# Patient Record
Sex: Female | Born: 1955 | Race: White | Hispanic: No | Marital: Married | Smoking: Never smoker
Health system: Southern US, Community
[De-identification: ages and names within clinical notes are randomized; demographics above are authoritative.]

## PROBLEM LIST (undated history)

## (undated) DIAGNOSIS — M8580 Other specified disorders of bone density and structure, unspecified site: Secondary | ICD-10-CM

## (undated) DIAGNOSIS — I10 Essential (primary) hypertension: Secondary | ICD-10-CM

## (undated) DIAGNOSIS — C4490 Unspecified malignant neoplasm of skin, unspecified: Secondary | ICD-10-CM

## (undated) DIAGNOSIS — Z8042 Family history of malignant neoplasm of prostate: Secondary | ICD-10-CM

## (undated) DIAGNOSIS — C50919 Malignant neoplasm of unspecified site of unspecified female breast: Secondary | ICD-10-CM

## (undated) DIAGNOSIS — E785 Hyperlipidemia, unspecified: Secondary | ICD-10-CM

## (undated) DIAGNOSIS — Z803 Family history of malignant neoplasm of breast: Secondary | ICD-10-CM

## (undated) DIAGNOSIS — Z923 Personal history of irradiation: Secondary | ICD-10-CM

## (undated) DIAGNOSIS — R112 Nausea with vomiting, unspecified: Secondary | ICD-10-CM

## (undated) DIAGNOSIS — Z9889 Other specified postprocedural states: Secondary | ICD-10-CM

## (undated) DIAGNOSIS — Z800 Family history of malignant neoplasm of digestive organs: Secondary | ICD-10-CM

## (undated) DIAGNOSIS — N189 Chronic kidney disease, unspecified: Secondary | ICD-10-CM

## (undated) HISTORY — DX: Other specified disorders of bone density and structure, unspecified site: M85.80

## (undated) HISTORY — DX: Family history of malignant neoplasm of prostate: Z80.42

## (undated) HISTORY — DX: Personal history of irradiation: Z92.3

## (undated) HISTORY — DX: Family history of malignant neoplasm of digestive organs: Z80.0

## (undated) HISTORY — PX: EYE SURGERY: SHX253

## (undated) HISTORY — PX: ROTATOR CUFF REPAIR: SHX139

## (undated) HISTORY — DX: Unspecified malignant neoplasm of skin, unspecified: C44.90

## (undated) HISTORY — DX: Essential (primary) hypertension: I10

## (undated) HISTORY — DX: Hyperlipidemia, unspecified: E78.5

## (undated) HISTORY — DX: Family history of malignant neoplasm of breast: Z80.3

## (undated) HISTORY — DX: Chronic kidney disease, unspecified: N18.9

---

## 2017-04-28 DIAGNOSIS — E782 Mixed hyperlipidemia: Secondary | ICD-10-CM | POA: Insufficient documentation

## 2017-04-28 DIAGNOSIS — I10 Essential (primary) hypertension: Secondary | ICD-10-CM | POA: Insufficient documentation

## 2017-04-28 DIAGNOSIS — N200 Calculus of kidney: Secondary | ICD-10-CM | POA: Insufficient documentation

## 2017-04-28 DIAGNOSIS — M8580 Other specified disorders of bone density and structure, unspecified site: Secondary | ICD-10-CM | POA: Insufficient documentation

## 2019-09-13 ENCOUNTER — Other Ambulatory Visit: Payer: Self-pay | Admitting: Family Medicine

## 2019-09-13 DIAGNOSIS — N6489 Other specified disorders of breast: Secondary | ICD-10-CM

## 2019-09-22 ENCOUNTER — Other Ambulatory Visit: Payer: Self-pay

## 2019-09-22 ENCOUNTER — Ambulatory Visit
Admission: RE | Admit: 2019-09-22 | Discharge: 2019-09-22 | Disposition: A | Discharge status: Home / Self Care | Payer: 59 | Source: Ambulatory Visit | Attending: Family Medicine | Admitting: Family Medicine

## 2019-09-22 ENCOUNTER — Ambulatory Visit: Payer: Self-pay | Admitting: General Surgery

## 2019-09-22 ENCOUNTER — Ambulatory Visit
Admission: RE | Admit: 2019-09-22 | Discharge: 2019-09-22 | Disposition: A | Discharge status: Home / Self Care | Payer: Self-pay | Source: Ambulatory Visit | Attending: Family Medicine | Admitting: Family Medicine

## 2019-09-22 DIAGNOSIS — N6489 Other specified disorders of breast: Secondary | ICD-10-CM

## 2019-09-22 DIAGNOSIS — Z170 Estrogen receptor positive status [ER+]: Secondary | ICD-10-CM

## 2019-09-22 DIAGNOSIS — C50212 Malignant neoplasm of upper-inner quadrant of left female breast: Secondary | ICD-10-CM

## 2019-09-22 HISTORY — PX: BREAST BIOPSY: SHX20

## 2019-09-28 ENCOUNTER — Telehealth: Payer: Self-pay | Admitting: Hematology

## 2019-09-28 NOTE — Telephone Encounter (Signed)
Received a new pt referral from Dr. Marlou Starks at [facility] for new dx of breast cancer. Sierra Lara has been cld and scheduled to see Dr. Burr Medico on 9/23 at 3pm. Pt aware to arrive 20 minutes early.

## 2019-09-30 ENCOUNTER — Other Ambulatory Visit: Payer: Self-pay | Admitting: General Surgery

## 2019-09-30 DIAGNOSIS — C50212 Malignant neoplasm of upper-inner quadrant of left female breast: Secondary | ICD-10-CM

## 2019-09-30 DIAGNOSIS — Z170 Estrogen receptor positive status [ER+]: Secondary | ICD-10-CM

## 2019-10-06 NOTE — Progress Notes (Signed)
[facility]   Telephone:([phone]) [phone] Fax:([phone]) [place] Note   Patient Care Team: [name], MD as PCP - General (Family Medicine)  Date of Service:  10/07/2019  CHIEF COMPLAINTS/PURPOSE OF CONSULTATION:  Newly diagnosed left breast cancer   REFERRING PHYSICIAN:  Dr Marlou Starks   Oncology History Overview Note  Cancer Staging Malignant neoplasm of upper-inner quadrant of left breast in female, estrogen receptor positive ([place]) Staging form: Breast, AJCC 8th Edition - Clinical stage from 09/22/2019: Stage IA (cT1a, cN0, cM0, G1, ER+, PR+, HER2-) - Signed by Truitt Merle, MD on 10/07/2019    Malignant neoplasm of upper-inner quadrant of left breast in female, estrogen receptor positive ([place])  09/08/2019 Mammogram   Left Diagnostic Mammogram  IMPRESSION 1.Suspicious 75m mass involving the UPPER OUTER QUADRANT of the LEFT breast at the 11:00 position approximately 6cmfn. This is the likely sonographic correlate for the screening mammographic architectural distortion.  2. No pathologic LEFT axillary lymphadnopathy.    09/13/2019 Initial Biopsy   Diagnosis  Breast, left, needle core biopsy, 11:00 position, 6cmfn -INVASIVE LOBULAR CARCINOMA, GRADE 2, AND LOBULAR CARCINOMA IN SITU -TUMOR INVOLVES ALL CORES AND MEASURES 7MM IN MAXIMUM EXTENT IN A SINGLE CORE.  -A BREAST PROGNOSTIC PROFILE WILL BE ORDERED ON BLOCK 1A AND SEPARATELY REPORTED -See comment    09/13/2019 Receptors her2   ER 90% positive, moderately staining intensity PR 80% positive, strong staining intensity  Proliferation Marker Ki67: 2%  [facility] Equivocal [facility]  09/22/2019 Cancer Staging   Staging form: Breast, AJCC 8th Edition - Clinical stage from 09/22/2019: Stage IA (cT1a, cN0, cM0, G1, ER+, PR+, HER2-) - Signed by FTruitt Merle MD on 10/07/2019   09/22/2019 Initial Biopsy   Diagnosis 09/22/19 Breast, left, needle core biopsy, uiq - INVASIVE MAMMARY CARCINOMA, SEE  COMMENT. - MAMMARY CARCINOMA IN SITU. Microscopic Comment The carcinoma appears grade 1 and measures 3 mm in greatest linear extent. E-cadherin will be ordered. Prognostic makers will be ordered. Dr. KVic Ripperhas reviewed the case. [facility].   09/22/2019 Receptors her2   PROGNOSTIC INDICATORS Results: IMMUNOHISTOCHEMICAL AND MORPHOMETRIC ANALYSIS PERFORMED MANUALLY The tumor cells are NEGATIVE for Her2 (1+). Estrogen Receptor: 95%, POSITIVE, STRONG STAINING INTENSITY Progesterone Receptor: 2%, POSITIVE, STRONG STAINING INTENSITY Proliferation Marker Ki67: 15%    10/07/2019 Initial Diagnosis   Malignant neoplasm of upper-inner quadrant of left breast in female, estrogen receptor positive ([place]      HISTORY OF PRESENTING ILLNESS:  [name]89y.o. female is a here because of newly diagnosed left breast cancer. The patient was referred by Dr TMarlou Starks The patient presents to the clinic today accompanied by her husband.   This was discovered by screening mammogram, she did not have any abnormal mammogram or breast biopsy prior to this.  Mammogram showed architectural distortion in the inner upper quadrant of left breast, ultrasound showed an irregular hypoechoic mass at the 11 o'clock position, measuring 5 x 4 x 5 mm, 6 cm from nipple.  Ultrasound of the axilla was negative for adenopathy.  She underwent biopsy in [place], which showed invasive lobular carcinoma, grade 2, and lobular carcinoma in situ.  ER/PR strongly positive, HER-2 negative.  She underwent second biopsy at breast center along, which showed same findings.  She denies any palpable breast mass, skin change or nipple discharge.  She feels well overall, denies any significant pain, or other new symptoms.  She has good appetite and energy level, her weight has been stable lately.  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  Her past medical  history is negative except hypertension.  She is married, lives with her husband.  They recently moved from [place] to [place].    GYN HISTORY  Menarchal: 14  LMP: 2011  Contraceptive: Yes, 16 years  HRT: no  G3P3    REVIEW OF SYSTEMS:    Constitutional: Denies fevers, chills or abnormal night sweats Eyes: Denies blurriness of vision, double vision or watery eyes Ears, nose, mouth, throat, and face: Denies mucositis or sore throat Respiratory: Denies cough, dyspnea or wheezes Cardiovascular: Denies palpitation, chest discomfort or lower extremity swelling Gastrointestinal:  Denies nausea, heartburn or change in bowel habits Skin: Denies abnormal skin rashes Lymphatics: Denies new lymphadenopathy or easy bruising Neurological:Denies numbness, tingling or new weaknesses Behavioral/Psych: Mood is stable, no new changes  All other systems were reviewed with the patient and are negative.   MEDICAL HISTORY:  Past Medical History:  Diagnosis Date  . Hypertension   . Skin cancer     SURGICAL HISTORY: Past Surgical History:  Procedure Laterality Date  . EYE SURGERY    . ROTATOR CUFF REPAIR Right     SOCIAL HISTORY: Social History   Socioeconomic History  . Marital status: Married    Spouse name: Not on file  . Number of children: 3  . Years of education: Not on file  . Highest education level: Not on file  Occupational History  . Occupation: nurse   Tobacco Use  . Smoking status: Never Smoker  . Smokeless tobacco: Never Used  Substance and Sexual Activity  . Alcohol use: Not Currently    Comment: social drinker   . Drug use: Never  . Sexual activity: Not on file  Other Topics Concern  . Not on file  Social History Narrative  . Not on file   Social Determinants of Health   Financial Resource Strain:   . Difficulty of Paying Living Expenses: Not on file  Food Insecurity:   . Worried About Charity fundraiser in the Last Year: Not on file  . Ran Out of  Food in the Last Year: Not on file  Transportation Needs:   . Lack of Transportation (Medical): Not on file  . Lack of Transportation (Non-Medical): Not on file  Physical Activity:   . Days of Exercise per Week: Not on file  . Minutes of Exercise per Session: Not on file  Stress:   . Feeling of Stress : Not on file  Social Connections:   . Frequency of Communication with Friends and Family: Not on file  . Frequency of Social Gatherings with Friends and Family: Not on file  . Attends Religious Services: Not on file  . Active Member of Clubs or Organizations: Not on file  . Attends Archivist Meetings: Not on file  . Marital Status: Not on file  Intimate Partner Violence:   . Fear of Current or Ex-Partner: Not on file  . Emotionally Abused: Not on file  . Physically Abused: Not on file  . Sexually Abused: Not on file    FAMILY HISTORY: Family History  Problem Relation Age of Onset  . Cancer Father        prostate cancer   . Cancer Maternal Aunt 60       breast cancer   . Cancer Maternal Grandmother  breast cancer    ALLERGIES:  has No Known Allergies.  MEDICATIONS:  Current Outpatient Medications  Medication Sig Dispense Refill  . amLODipine (NORVASC) 2.5 MG tablet Take 2.5 mg by mouth daily.    Marland Kitchen ezetimibe (ZETIA) 10 MG tablet Take 10 mg by mouth daily.     No current facility-administered medications for this visit.    PHYSICAL EXAMINATION: ECOG PERFORMANCE STATUS: 0 - Asymptomatic  Vitals:   10/07/19 1456  BP: 130/74  Pulse: 77  Resp: 17  Temp: 98.7 F (37.1 C)  SpO2: 100%   Filed Weights   10/07/19 1456  Weight: 163 lb 11.2 oz (74.3 kg)    GENERAL:alert, no distress and comfortable SKIN: skin color, texture, turgor are normal, no rashes or significant lesions EYES: normal, Conjunctiva are pink and non-injected, sclera clear NECK: supple, thyroid normal size, non-tender, without nodularity LYMPH:  no palpable lymphadenopathy in the  cervical, axillary  LUNGS: clear to auscultation and percussion with normal breathing effort HEART: regular rate & rhythm and no murmurs and no lower extremity edema ABDOMEN:abdomen soft, non-tender and normal bowel sounds Musculoskeletal:no cyanosis of digits and no clubbing  NEURO: alert & oriented x 3 with fluent speech, no focal motor/sensory deficits BREAST: (+) Skin ecchymosis from biopsy in the upper inner quadrant of left breast, no palpable mass, nodules or adenopathy bilaterally. Breast exam benign.  LABORATORY DATA:  I have reviewed the data as listed No flowsheet data found.  No flowsheet data found.    PATHOLOGY    Diagnosis 09/22/19 Breast, left, needle core biopsy, uiq - INVASIVE MAMMARY CARCINOMA, SEE COMMENT. - MAMMARY CARCINOMA IN SITU. Microscopic Comment The carcinoma appears grade 1 and measures 3 mm in greatest linear extent. E-cadherin will be ordered. Prognostic makers will be ordered. Dr. Vic Ripper has reviewed the case. The case was called to [facility] on 09/23/2019.  PROGNOSTIC INDICATORS Results: IMMUNOHISTOCHEMICAL AND MORPHOMETRIC ANALYSIS PERFORMED MANUALLY The tumor cells are NEGATIVE for Her2 (1+). Estrogen Receptor: 95%, POSITIVE, STRONG STAINING INTENSITY Progesterone Receptor: 2%, POSITIVE, STRONG STAINING INTENSITY Proliferation Marker Ki67: 15%   RADIOGRAPHIC STUDIES: I have personally reviewed the radiological images as listed and agreed with the findings in the report. MM CLIP PLACEMENT LEFT  Result Date: 09/22/2019 CLINICAL DATA:  Status post stereotactic biopsy of left breast distortion. EXAM: DIAGNOSTIC LEFT MAMMOGRAM POST STEREOTACTIC BIOPSY COMPARISON:  Previous exam(s). FINDINGS: Mammographic images were obtained following stereotactic guided biopsy of distortion in the upper inner quadrant of the left breast. The biopsy marking clip is in expected location in the upper-inner quadrant of the left breast.  IMPRESSION: Appropriate positioning of the coil shaped biopsy marking clip at the site of biopsy in the upper-inner quadrant of the left breast. Final Assessment: Post Procedure Mammograms for Marker Placement Electronically Signed   By: Lillia Mountain M.D.   On: 09/22/2019 11:48   MM LT BREAST BX W LOC DEV 1ST LESION IMAGE BX SPEC STEREO GUIDE  Addendum Date: 09/24/2019   ADDENDUM REPORT: 09/23/2019 14:25 ADDENDUM: Pathology revealed GRADE I INVASIVE MAMMARY CARCINOMA, MAMMARY CARCINOMA IN SITU of the LEFT breast, upper inner quadrant. This was found to be concordant by Dr. Lillia Mountain. Pathology results were discussed with the patient by telephone. The patient reported doing well after the biopsy with tenderness at the site. Post biopsy instructions and care were reviewed and questions were answered. The patient was encouraged to call [facility] for any additional concerns. The patient has a  recent diagnosis of left breast cancer and should follow her outlined treatment plan. Pathology results reported by Stacie Acres RN on 09/23/2019. Electronically Signed   By: Lillia Mountain M.D.   On: 09/23/2019 14:25   Result Date: 09/24/2019 CLINICAL DATA:  Biopsy proven invasive lobular carcinoma in the left breast. Stereotactic biopsy of distortion recommended. EXAM: RIGHT BREAST STEREOTACTIC CORE NEEDLE BIOPSY COMPARISON:  Previous exams. FINDINGS: The patient and I discussed the procedure of stereotactic-guided biopsy including benefits and alternatives. We discussed the high likelihood of a successful procedure. We discussed the risks of the procedure including infection, bleeding, tissue injury, clip migration, and inadequate sampling. Informed written consent was given. The usual time out protocol was performed immediately prior to the procedure. Using sterile technique and 1% lidocaine and 1% lidocaine with epinephrine as local anesthetic, under stereotactic guidance, a 9 gauge vacuum  assisted device was used to perform core needle biopsy of distortion in the upper inner quadrant of the left breast using a superior to inferior approach. Lesion quadrant: Upper inner quadrant At the conclusion of the procedure, coil shaped tissue marker clip was deployed into the biopsy cavity. Follow-up 2-view mammogram was performed and dictated separately. IMPRESSION: Stereotactic-guided biopsy of the left breast. No apparent complications. Electronically Signed: By: Lillia Mountain M.D. On: 09/22/2019 11:42    ASSESSMENT & PLAN:  Sierra Lara is a 64 y.o. female with a history of    1.  Malignant neoplasm of upper-inner quadrant of left female breast , invasive lobular carcinoma, and LCIS, Stage Ia, cT1bN0M0, ER+/PR+/HER2-, Grade I -We discussed her image findings and the biopsy results in great details. -Given the very early stage disease, she is likely a candidate for lumpectomy and sentinel lymph node biopsy.  She was seen by Dr. Marlou Starks and will proceed with surgery soon.  -Due to the lobular histology, that the ptosis has ordered bilateral breast MRI, which is scheduled for next week -We discussed the nature history of breast cancer, treatment options and indications, and cancer surveillance after surgery  -We discussed the risk of cancer recurrence after surgery, which depends on stage and biology of tumor. -We discussed that lobular carcinoma is less sensitive to chemotherapy, more sensitive to antiestrogen therapy, and slightly higher risk of late recurrence than ductal carcinoma. -If her surgical sample shows tumor less than 1 cm, I would not recommend Oncotype or adjuvant chemotherapy.  If surgical path shows positive lymph nodes, I will order mammaprint for risk stratification. -Given the strong ER and PR expression in her postmenopausal status, I recommend adjuvant endocrine therapy with aromatase inhibitor for a total of 5-10 years to reduce the risk of cancer recurrence. Potential benefits  and side effects were discussed with patient and she is interested. -We also discussed the breast cancer surveillance after her surgery. She will continue annual screening mammogram, self exam, and a routine office visit with lab and exam with [place]. -I encouraged her to have healthy diet and exercise regularly  2. Genetics -Due to her family history, she qualifies for genetic testing to rule out cancer syndrome, she is interested, will refer her.  3. HTN -on amlodipine, follow-up with PCP.  PLAN:  -Bilateral breast MRI with and without contrast next week -We will proceed left breast lumpectomy and sentinel node with Dr. Marlou Starks soon -We will consider Oncotype if surgical pathology shows tumor more than 1cm, or MammaPrint if she has 3 or less positive node -I will see her back after adjuvant radiation, or sooner if  needed. -Genetic referral     Orders Placed This Encounter  Procedures  . CBC with Differential (Cancer Center Only)    Standing Status:   Standing    Number of Occurrences:   10    Standing Expiration Date:   10/06/2020  . CMP ([facility] only)    Standing Status:   Standing    Number of Occurrences:   10    Standing Expiration Date:   10/06/2020  . Vitamin D 25 hydroxy    Standing Status:   Standing    Number of Occurrences:   10    Standing Expiration Date:   10/06/2020  . Ambulatory referral to Genetics    Referral Priority:   Routine    Referral Type:   Consultation    Referral Reason:   Specialty Services Required    Number of Visits Requested:   1    All questions were answered. The patient knows to call the clinic with any problems, questions or concerns. The total time spent in the appointment was 55 minutes.     Truitt Merle, MD 10/07/2019  I, Joslyn Devon, am acting as scribe for Truitt Merle, MD.   I have reviewed the above documentation for accuracy and completeness, and I agree with the above.

## 2019-10-07 ENCOUNTER — Inpatient Hospital Stay: Payer: 59 | Attending: Hematology | Admitting: Hematology

## 2019-10-07 ENCOUNTER — Other Ambulatory Visit: Payer: Self-pay

## 2019-10-07 ENCOUNTER — Encounter: Payer: Self-pay | Admitting: Hematology

## 2019-10-07 DIAGNOSIS — C50212 Malignant neoplasm of upper-inner quadrant of left female breast: Secondary | ICD-10-CM | POA: Insufficient documentation

## 2019-10-07 DIAGNOSIS — Z170 Estrogen receptor positive status [ER+]: Secondary | ICD-10-CM | POA: Diagnosis not present

## 2019-10-07 DIAGNOSIS — I10 Essential (primary) hypertension: Secondary | ICD-10-CM | POA: Insufficient documentation

## 2019-10-08 ENCOUNTER — Encounter: Payer: Self-pay | Admitting: *Deleted

## 2019-10-08 ENCOUNTER — Telehealth: Payer: Self-pay | Admitting: Hematology

## 2019-10-08 ENCOUNTER — Ambulatory Visit
Admission: RE | Admit: 2019-10-08 | Discharge: 2019-10-08 | Disposition: A | Discharge status: Home / Self Care | Payer: 59 | Source: Ambulatory Visit | Attending: General Surgery | Admitting: General Surgery

## 2019-10-08 ENCOUNTER — Encounter: Payer: Self-pay | Admitting: Hematology

## 2019-10-08 DIAGNOSIS — C50212 Malignant neoplasm of upper-inner quadrant of left female breast: Secondary | ICD-10-CM

## 2019-10-08 MED ORDER — GADOBUTROL 1 MMOL/ML IV SOLN
7.0000 mL | Freq: Once | INTRAVENOUS | Status: AC | PRN
  Administered 2019-10-08: 7 mL via INTRAVENOUS

## 2019-10-08 NOTE — Telephone Encounter (Signed)
Scheduled appt per 9/23 sch msg - pt is aware of appt date and time

## 2019-10-11 ENCOUNTER — Encounter: Payer: Self-pay | Admitting: *Deleted

## 2019-10-11 ENCOUNTER — Telehealth: Payer: Self-pay | Admitting: *Deleted

## 2019-10-11 NOTE — Telephone Encounter (Signed)
Called pt with MRI results. Discussed next step is surgery and will receive call with sx date and instructions from Dr. Ethlyn Gallery office. Discussed and confirmed genetic counseling appt on 10/20. Pt denies further questions or needs at this time.

## 2019-10-13 ENCOUNTER — Ambulatory Visit: Payer: 59

## 2019-10-13 ENCOUNTER — Institutional Professional Consult (permissible substitution): Payer: 59 | Admitting: Radiation Oncology

## 2019-10-15 ENCOUNTER — Telehealth: Payer: Self-pay | Admitting: *Deleted

## 2019-10-15 ENCOUNTER — Other Ambulatory Visit: Payer: Self-pay | Admitting: General Surgery

## 2019-10-15 ENCOUNTER — Encounter: Payer: Self-pay | Admitting: *Deleted

## 2019-10-15 DIAGNOSIS — C50212 Malignant neoplasm of upper-inner quadrant of left female breast: Secondary | ICD-10-CM

## 2019-10-15 DIAGNOSIS — Z170 Estrogen receptor positive status [ER+]: Secondary | ICD-10-CM

## 2019-10-15 MED ORDER — ANASTROZOLE 1 MG PO TABS: 1.0000 mg | ORAL_TABLET | Freq: Every day | ORAL | 6 refills | Status: DC

## 2019-10-15 NOTE — Telephone Encounter (Signed)
Spoke to pt concerning neo AI. Pt agree to anastrozole. Gave instructions. Prescription sent to verified pharmacy. Msg sent to team regarding initiation of AI

## 2019-10-18 ENCOUNTER — Encounter: Payer: Self-pay | Admitting: *Deleted

## 2019-10-19 ENCOUNTER — Telehealth: Payer: Self-pay | Admitting: Genetic Counselor

## 2019-10-19 NOTE — Telephone Encounter (Signed)
Per 10/5 scheduling message Rescheduled appointments to 10/27 per patient request- spoke with patient

## 2019-10-20 ENCOUNTER — Encounter: Payer: Self-pay | Admitting: Radiation Oncology

## 2019-10-20 ENCOUNTER — Ambulatory Visit
Admission: RE | Admit: 2019-10-20 | Discharge: 2019-10-20 | Disposition: A | Discharge status: Home / Self Care | Payer: 59 | Source: Ambulatory Visit | Attending: Radiation Oncology | Admitting: Radiation Oncology

## 2019-10-20 ENCOUNTER — Other Ambulatory Visit: Payer: Self-pay

## 2019-10-20 DIAGNOSIS — Z803 Family history of malignant neoplasm of breast: Secondary | ICD-10-CM | POA: Diagnosis not present

## 2019-10-20 DIAGNOSIS — I10 Essential (primary) hypertension: Secondary | ICD-10-CM | POA: Insufficient documentation

## 2019-10-20 DIAGNOSIS — Z170 Estrogen receptor positive status [ER+]: Secondary | ICD-10-CM | POA: Insufficient documentation

## 2019-10-20 DIAGNOSIS — Z923 Personal history of irradiation: Secondary | ICD-10-CM | POA: Diagnosis not present

## 2019-10-20 DIAGNOSIS — Z85828 Personal history of other malignant neoplasm of skin: Secondary | ICD-10-CM | POA: Diagnosis not present

## 2019-10-20 DIAGNOSIS — Z79811 Long term (current) use of aromatase inhibitors: Secondary | ICD-10-CM | POA: Insufficient documentation

## 2019-10-20 DIAGNOSIS — C50212 Malignant neoplasm of upper-inner quadrant of left female breast: Secondary | ICD-10-CM

## 2019-10-20 NOTE — Progress Notes (Signed)
Patient here for a consult with Dr. Sondra Come.  Location of Breast Cancer: left breast  Histology per Pathology Report: Invasive Lobular Carcinoma Grade 2 with LCIS  Receptor Status: ER+ / PR+ / Her2-, Grade 1  09/08/19 Patient had a suspicious mass on the left breast on mammogram  Past/Anticipated interventions by surgeon, if any: will have lumpectomy 11/5  Past/Anticipated interventions by medical oncology, if any:  Anastrazole  Lymphedema issues, if any:  n/a  Pain issues, if any:  0  SAFETY ISSUES:   Prior radiation? no   Pacemaker/ICD? no  Is the patient on methotrexate? no  Possible pregnancy? postmenopausal  Current Complaints / other details:  Accompanied by her husband.  BP 103/65 (BP Location: Left Arm, Patient Position: Sitting)   Pulse 79   Temp (!) 96.7 F (35.9 C) (Tympanic)   Resp 18   Ht '5\' 5"'  (1.651 m)   Wt 163 lb 2 oz (74 kg)   SpO2 99%   BMI 27.15 kg/m   Wt Readings from Last 3 Encounters:  10/20/19 163 lb 2 oz (74 kg)  10/07/19 163 lb 11.2 oz (74.3 kg)          De Burrs, RN 10/20/2019,12:47 PM

## 2019-10-20 NOTE — Progress Notes (Signed)
Radiation Oncology         [phone] ________________________________  Initial Outpatient Consultation  Name: Sierra Lara MRN: [id number]  Date: 10/20/2019  DOB: 1955-12-16  BX:IDHWYSH, Audree Camel, MD  Jovita Kussmaul, MD   REFERRING PHYSICIAN: Autumn Messing III, MD  DIAGNOSIS: The encounter diagnosis was Malignant neoplasm of upper-inner quadrant of left breast in female, estrogen receptor positive ([place]).  Stage IA (cT1a, cN0, cM0) Left Breast UIQ, Invasive Lobular Carcinoma with LCIS, ER+ / PR+ / Her2-, Grade 1  HISTORY OF PRESENT ILLNESS::Sierra Lara is a 64 y.o. female who is seen as a courtesy of Dr. Marlou Starks for an opinion concerning radiation therapy as part of management for her recently diagnosed breast cancer. Today, she is accompanied by her husband. She had a routine screening mammography that showed a 5 mm area of distortion in the upper inner quadrant of the left breast. She underwent unilateral diagnostic mammography on 09/08/2019 that showed a suspicious 5 mm mass in the upper inner quadrant of the left breast at the 11:00 o'clock position approximately 6 cm from the nipple. There was no pathologic left axillary lymphadenopathy.  Biopsy at [facility] was performed on 09/13/2019 and revealed grade 2 invasive lobular carcinoma and lobular carcinoma in-situ. The tumor involved all cores and measured 7 mm in maximum extent. Prognostic indicators were significant for estrogen receptor, 90% positive with a moderate staining intensity and progesterone receptor, 80% positive with a strong staining intensity. Proliferation marker Ki67 at 2%. HER2 negative.  An additional biopsy was performed on 09/22/2019 that revealed grade 1 invasive mammary carcinoma with mammary carcinoma in-situ. E-cadherin was negative, consistent with a lobular phenotype. Prognostic indicators were significant for estrogen receptor, 95% positive and progesterone receptor, 2% positive, both with strong  staining intensities. Proliferation marker Ki67 at 15%. HER2 negative.  The patient was referred to Dr. Burr Medico by Dr. Marlou Starks and was seen in consultation on 10/07/2019. At that time, it was recommended that she proceed with bilateral breast MRI, left breast lumpectomy with sentinel lymph node biopsy, Oncotype if surgical pathology shows that the tumor is greater than 1 cm or MammaPrint if she has three or less positive nodes, genetic testing, and adjuvant radiation therapy.  Bilateral breast MRI was performed which is summarized below:  1. 1.5 cm biopsy-proven invasive mammary carcinoma and mammary carcinoma in situ in the posterior aspect of the upper inner quadrant of the left breast. 2. 0.8 cm biopsy-proven invasive lobular carcinoma and lobular carcinoma in situ in the upper inner quadrant of the left breast in the middle 3rd. 3. The 2 areas of malignancy in the upper inner left breast span 2.8 cm on the MR images with the clips located 3 cm apart on the post clip placement mammogram images.   PREVIOUS RADIATION THERAPY: No  PAST MEDICAL HISTORY:  Past Medical History:  Diagnosis Date   Hypertension    Skin cancer     PAST SURGICAL HISTORY: Past Surgical History:  Procedure Laterality Date   EYE SURGERY     ROTATOR CUFF REPAIR Right     FAMILY HISTORY:  Family History  Problem Relation Age of Onset   Cancer Father        prostate cancer    Cancer Maternal Aunt 60       breast cancer    Cancer Maternal Grandmother        breast cancer    SOCIAL HISTORY:  Social History   Tobacco Use   Smoking  status: Never Smoker   Smokeless tobacco: Never Used  Substance Use Topics   Alcohol use: Not Currently    Comment: social drinker    Drug use: Never    ALLERGIES: No Known Allergies  MEDICATIONS:  Current Outpatient Medications  Medication Sig Dispense Refill   amLODipine (NORVASC) 2.5 MG tablet Take 2.5 mg by mouth daily.     anastrozole (ARIMIDEX) 1 MG  tablet Take 1 tablet (1 mg total) by mouth daily. 30 tablet 6   ezetimibe (ZETIA) 10 MG tablet Take 10 mg by mouth daily.     No current facility-administered medications for this encounter.    REVIEW OF SYSTEMS:  A 10+ POINT REVIEW OF SYSTEMS WAS OBTAINED including neurology, dermatology, psychiatry, cardiac, respiratory, lymph, extremities, GI, GU, musculoskeletal, constitutional, reproductive, HEENT.  She has some soreness in the left breast but no actual pain.  She denies any new bony pain headaches dizziness or blurred vision.  She recently started Arimidex and is noticed some stiffness in her thumb joints but no other issues   PHYSICAL EXAM:  height is '5\' 5"'  (1.651 m) and weight is 163 lb 2 oz (74 kg). Her tympanic temperature is 96.7 F (35.9 C) (abnormal). Her blood pressure is 103/65 and her pulse is 79. Her respiration is 18 and oxygen saturation is 99%.   General: Alert and oriented, in no acute distress HEENT: Head is normocephalic. Extraocular movements are intact. Oropharynx is clear. Neck: Neck is supple, no palpable cervical or supraclavicular lymphadenopathy. Heart: Regular in rate and rhythm with no murmurs, rubs, or gallops. Chest: Clear to auscultation bilaterally, with no rhonchi, wheezes, or rales. Abdomen: Soft, nontender, nondistended, with no rigidity or guarding. Extremities: No cyanosis or edema. Lymphatics: see Neck Exam Skin: No concerning lesions. Musculoskeletal: symmetric strength and muscle tone throughout. Neurologic: Cranial nerves II through XII are grossly intact. No obvious focalities. Speech is fluent. Coordination is intact. Psychiatric: Judgment and insight are intact. Affect is appropriate. Right breast: No palpable mass, nipple discharge, or bleeding. Left breast: No palpable mass, nipple discharge, or bleeding.  Bruising noted in the upper inner quadrant from her recent biopsies  ECOG = 1  0 - Asymptomatic (Fully active, able to carry on all  predisease activities without restriction)  1 - Symptomatic but completely ambulatory (Restricted in physically strenuous activity but ambulatory and able to carry out work of a light or sedentary nature. For example, light housework, office work)  2 - Symptomatic, <50% in bed during the day (Ambulatory and capable of all self care but unable to carry out any work activities. Up and about more than 50% of waking hours)  3 - Symptomatic, >50% in bed, but not bedbound (Capable of only limited self-care, confined to bed or chair 50% or more of waking hours)  4 - Bedbound (Completely disabled. Cannot carry on any self-care. Totally confined to bed or chair)  5 - Death   Eustace Pen MM, Creech RH, Tormey DC, et al. [phone]). "Toxicity and response criteria of the [facility]". [place] Oncol. 5 (6): 649-55  LABORATORY DATA:  No results found for: WBC, HGB, HCT, MCV, PLT, NEUTROABS No results found for: NA, K, CL, CO2, GLUCOSE, CREATININE, CALCIUM    RADIOGRAPHY: MR BREAST BILATERAL W WO CONTRAST INC CAD  Result Date: 10/08/2019 CLINICAL DATA:  Recently diagnosed invasive lobular carcinoma and lobular carcinoma in situ in the 11 o'clock position of left breast, 6 cm from the nipple, on ultrasound-guided core needle  biopsy. Recently diagnosed invasive mammary carcinoma and mammary carcinoma in situ in the upper inner quadrant of the left breast on recent stereotactic guided core needle biopsy of an area of architectural distortion. The clips are located 3 cm apart on a post clip placement mammogram dated 09/22/2019. Family history of breast cancer in her maternal grandmother and a maternal aunt. LABS:  None obtained on site today. EXAM: BILATERAL BREAST MRI WITH AND WITHOUT CONTRAST TECHNIQUE: Multiplanar, multisequence MR images of both breasts were obtained prior to and following the intravenous administration of 7 ml of Gadavist Three-dimensional MR images were rendered by  post-processing of the original MR data on an independent workstation. The three-dimensional MR images were interpreted, and findings are reported in the following complete MRI report for this study. Three dimensional images were evaluated at the independent interpreting workstation using the DynaCAD thin client. COMPARISON:  Recent mammogram, ultrasound and biopsy examinations. FINDINGS: Breast composition: c. Heterogeneous fibroglandular tissue. Background parenchymal enhancement: Minimal on the right and mild on the left. Right breast: No mass or abnormal enhancement. Left breast: Irregular enhancing mass at the location of a biopsy marker clip artifact in the posterior aspect of the upper inner quadrant of the left breast. The irregular enhancing mass measures 1.5 x 0.4 cm on image number 66 series 6. This corresponds to the recently biopsied distortion in the upper inner quadrant of the left breast demonstrating invasive mammary carcinoma and mammary carcinoma in situ, containing a coil shaped biopsy marker clip. This has a mixture of enhancement kinetics, including rapid wash-in/washout. More anteriorly, laterally and superiorly in the upper inner quadrant of the left breast, in the middle 3rd, there is a similar appearing mass measuring 0.8 x 0.8 cm on image number 52 series 6, corresponding to the recently biopsied mass demonstrating invasive lobular carcinoma and lobular carcinoma in situ in the 11 o'clock position of the breast, containing a ribbon shaped biopsy marker clip. This also has a mixture of enhancement kinetics, including rapid wash-in/washout. Bind, the 2 masses span 2.8 cm with no intervening abnormal enhancement. Lymph nodes: No abnormal appearing lymph nodes. Ancillary findings:  None. IMPRESSION: 1. 1.5 cm biopsy-proven invasive mammary carcinoma and mammary carcinoma in situ in the posterior aspect of the upper inner quadrant of the left breast. 2. 0.8 cm biopsy-proven invasive lobular  carcinoma and lobular carcinoma in situ in the upper inner quadrant of the left breast in the middle 3rd. 3. The 2 areas of malignancy in the upper inner left breast span 2.8 cm on the MR images with the clips located 3 cm apart on the post clip placement mammogram images. 4. No evidence of malignancy on the right. 5. No adenopathy. RECOMMENDATION: Treatment plan for the 2 areas of biopsy-proven malignancy in the upper inner quadrant of the left breast. BI-RADS CATEGORY  6: Known biopsy-proven malignancy. Electronically Signed   By: Claudie Revering M.D.   On: 10/08/2019 14:13   MM CLIP PLACEMENT LEFT  Result Date: 09/22/2019 CLINICAL DATA:  Status post stereotactic biopsy of left breast distortion. EXAM: DIAGNOSTIC LEFT MAMMOGRAM POST STEREOTACTIC BIOPSY COMPARISON:  Previous exam(s). FINDINGS: Mammographic images were obtained following stereotactic guided biopsy of distortion in the upper inner quadrant of the left breast. The biopsy marking clip is in expected location in the upper-inner quadrant of the left breast. IMPRESSION: Appropriate positioning of the coil shaped biopsy marking clip at the site of biopsy in the upper-inner quadrant of the left breast. Final Assessment: Post Procedure Mammograms for Marker  Placement Electronically Signed   By: Lillia Mountain M.D.   On: 09/22/2019 11:48   MM LT BREAST BX W LOC DEV 1ST LESION IMAGE BX SPEC STEREO GUIDE  Addendum Date: 09/24/2019   ADDENDUM REPORT: 09/23/2019 14:25 ADDENDUM: Pathology revealed GRADE I INVASIVE MAMMARY CARCINOMA, MAMMARY CARCINOMA IN SITU of the LEFT breast, upper inner quadrant. This was found to be concordant by Dr. Lillia Mountain. Pathology results were discussed with the patient by telephone. The patient reported doing well after the biopsy with tenderness at the site. Post biopsy instructions and care were reviewed and questions were answered. The patient was encouraged to call [facility] for any additional  concerns. The patient has a recent diagnosis of left breast cancer and should follow her outlined treatment plan. Pathology results reported by Stacie Acres RN on 09/23/2019. Electronically Signed   By: Lillia Mountain M.D.   On: 09/23/2019 14:25   Result Date: 09/24/2019 CLINICAL DATA:  Biopsy proven invasive lobular carcinoma in the left breast. Stereotactic biopsy of distortion recommended. EXAM: RIGHT BREAST STEREOTACTIC CORE NEEDLE BIOPSY COMPARISON:  Previous exams. FINDINGS: The patient and I discussed the procedure of stereotactic-guided biopsy including benefits and alternatives. We discussed the high likelihood of a successful procedure. We discussed the risks of the procedure including infection, bleeding, tissue injury, clip migration, and inadequate sampling. Informed written consent was given. The usual time out protocol was performed immediately prior to the procedure. Using sterile technique and 1% lidocaine and 1% lidocaine with epinephrine as local anesthetic, under stereotactic guidance, a 9 gauge vacuum assisted device was used to perform core needle biopsy of distortion in the upper inner quadrant of the left breast using a superior to inferior approach. Lesion quadrant: Upper inner quadrant At the conclusion of the procedure, coil shaped tissue marker clip was deployed into the biopsy cavity. Follow-up 2-view mammogram was performed and dictated separately. IMPRESSION: Stereotactic-guided biopsy of the left breast. No apparent complications. Electronically Signed: By: Lillia Mountain M.D. On: 09/22/2019 11:42      IMPRESSION: Stage IA (cT1a, cN0, cM0) Left Breast UIQ, Invasive Lobular Carcinoma with LCIS, ER+ / PR+ / Her2-, Grade 1.  Patient was found to have 2 areas of malignancy in close proximity to 1 another in the upper inner aspect of the left breast.  She would appear to be a good candidate for breast conserving surgery followed by adjuvant radiation therapy.  I discussed the general course  of radiation therapy side effects and potential toxicities of adjuvant radiation therapy with the patient and her husband.  She appears to understand and wishes to proceed.  Assuming her lymph nodes are clear she would also be a good candidate for hypofractionated accelerated radiation therapy over approximately 4 weeks.   PLAN: The patient is scheduled to undergo a left breast lumpectomy with Dr. Marlou Starks in the near future.  This has been scheduled for November 5.  The patient has started Arimidex in the meantime as above.  Total time spent in this encounter was 65 minutes which included reviewing the patient's most recent consultations, mammogram, ultrasound, biopsies, pathology reports, physical examination, and documentation.   ------------------------------------------------  Blair Promise, PhD, MD  This document serves as a record of services personally performed by Gery Pray, MD. It was created on his behalf by Clerance Lav, a trained medical scribe. The creation of this record is based on the scribe's personal observations and the provider's statements to them. This document has been checked  and approved by the attending provider.

## 2019-11-03 ENCOUNTER — Other Ambulatory Visit: Payer: 59

## 2019-11-03 ENCOUNTER — Encounter: Payer: 59 | Admitting: Genetic Counselor

## 2019-11-10 ENCOUNTER — Encounter: Payer: Self-pay | Admitting: Genetic Counselor

## 2019-11-10 ENCOUNTER — Inpatient Hospital Stay: Payer: 59 | Attending: Hematology | Admitting: Genetic Counselor

## 2019-11-10 ENCOUNTER — Other Ambulatory Visit: Payer: Self-pay | Admitting: Genetic Counselor

## 2019-11-10 ENCOUNTER — Inpatient Hospital Stay: Payer: 59

## 2019-11-10 ENCOUNTER — Other Ambulatory Visit: Payer: Self-pay

## 2019-11-10 DIAGNOSIS — Z800 Family history of malignant neoplasm of digestive organs: Secondary | ICD-10-CM | POA: Diagnosis not present

## 2019-11-10 DIAGNOSIS — C50212 Malignant neoplasm of upper-inner quadrant of left female breast: Secondary | ICD-10-CM

## 2019-11-10 DIAGNOSIS — Z8042 Family history of malignant neoplasm of prostate: Secondary | ICD-10-CM | POA: Diagnosis not present

## 2019-11-10 DIAGNOSIS — Z803 Family history of malignant neoplasm of breast: Secondary | ICD-10-CM | POA: Insufficient documentation

## 2019-11-10 DIAGNOSIS — Z170 Estrogen receptor positive status [ER+]: Secondary | ICD-10-CM

## 2019-11-10 LAB — GENETIC SCREENING ORDER

## 2019-11-10 NOTE — Progress Notes (Signed)
REFERRING PROVIDER: Truitt Merle, MD [address] [place],  [place] [address]  PRIMARY PROVIDER:  [name], MD  PRIMARY REASON FOR VISIT:  1. Malignant neoplasm of upper-inner quadrant of left breast in female, estrogen receptor positive ([place])   2. Family history of breast cancer   3. Family history of prostate cancer   4. Family history of colon cancer      HISTORY OF PRESENT ILLNESS:   Sierra Lara, a 64 y.o. female, was seen for a [place] cancer genetics consultation at the request of Dr. Burr Medico due to a personal and family history of cancer.  Sierra Lara presents to clinic today to discuss the possibility of a hereditary predisposition to cancer, genetic testing, and to further clarify her future cancer risks, as well as potential cancer risks for family members.   In September 2021, at the age of 89, Ms. Sierra Lara was diagnosed with invasive ductal carcinoma of the left breast.  The tumor is ER+/PR+/Her2-. The treatment plan includes lumpectomy and radiation.    CANCER HISTORY:  Oncology History Overview Note  Cancer Staging Malignant neoplasm of upper-inner quadrant of left breast in female, estrogen receptor positive ([name]) Staging form: Breast, AJCC 8th Edition - Clinical stage from 09/22/2019: Stage IA (cT1a, cN0, cM0, G1, ER+, PR+, HER2-) - Signed by Truitt Merle, MD on 10/07/2019    Malignant neoplasm of upper-inner quadrant of left breast in female, estrogen receptor positive ([name])  09/08/2019 Mammogram   Left Diagnostic Mammogram  IMPRESSION 1.Suspicious 23m mass involving the UPPER OUTER QUADRANT of the LEFT breast at the 11:00 position approximately 6cmfn. This is the likely sonographic correlate for the screening mammographic architectural distortion.  2. No pathologic LEFT axillary lymphadnopathy.    09/13/2019 Initial Biopsy   Diagnosis  Breast, left, needle core biopsy, 11:00 position, 6cmfn -INVASIVE LOBULAR CARCINOMA, GRADE 2, AND LOBULAR CARCINOMA IN  SITU -TUMOR INVOLVES ALL CORES AND MEASURES 7MM IN MAXIMUM EXTENT IN A SINGLE CORE.  -A BREAST PROGNOSTIC PROFILE WILL BE ORDERED ON BLOCK 1A AND SEPARATELY REPORTED -See comment    09/13/2019 Receptors her2   ER 90% positive, moderately staining intensity PR 80% positive, strong staining intensity  Proliferation Marker Ki67: 2%  HER2 Equivocal - [facility]  09/22/2019 Cancer Staging   Staging form: Breast, AJCC 8th Edition - Clinical stage from 09/22/2019: Stage IA (cT1a, cN0, cM0, G1, ER+, PR+, HER2-) - Signed by FTruitt Merle MD on 10/07/2019   09/22/2019 Initial Biopsy   Diagnosis 09/22/19 Breast, left, needle core biopsy, uiq - INVASIVE MAMMARY CARCINOMA, SEE COMMENT. - MAMMARY CARCINOMA IN SITU. Microscopic Comment The carcinoma appears grade 1 and measures 3 mm in greatest linear extent. E-cadherin will be ordered. Prognostic makers will be ordered. Dr. KVic Ripperhas reviewed the case. [facility]   09/22/2019 Receptors her2   PROGNOSTIC INDICATORS Results: IMMUNOHISTOCHEMICAL AND MORPHOMETRIC ANALYSIS PERFORMED MANUALLY The tumor cells are NEGATIVE for Her2 (1+). Estrogen Receptor: 95%, POSITIVE, STRONG STAINING INTENSITY Progesterone Receptor: 2%, POSITIVE, STRONG STAINING INTENSITY Proliferation Marker Ki67: 15%    10/07/2019 Initial Diagnosis   Malignant neoplasm of upper-inner quadrant of left breast in female, estrogen receptor positive ([place]      RISK FACTORS:  Menarche was at age 89  First live birth at age 64  OCP use for approximately 10+ years.  Ovaries intact: yes.  Hysterectomy: no.  Menopausal status: postmenopausal.  HRT use: 0 years. Colonoscopy: yes;  fewer than 5 polyps. Mammogram within the last year: yes. Number of breast biopsies: 1. Up to date with pelvic exams: yes. Any excessive radiation exposure in the past: no  Past Medical History:  Diagnosis Date  . Family history of  breast cancer   . Family history of colon cancer   . Family history of prostate cancer   . Hypertension   . Skin cancer     Past Surgical History:  Procedure Laterality Date  . EYE SURGERY    . ROTATOR CUFF REPAIR Right     Social History   Socioeconomic History  . Marital status: Married    Spouse name: Not on file  . Number of children: 3  . Years of education: Not on file  . Highest education level: Not on file  Occupational History  . Occupation: nurse   Tobacco Use  . Smoking status: Never Smoker  . Smokeless tobacco: Never Used  Substance and Sexual Activity  . Alcohol use: Not Currently    Comment: social drinker   . Drug use: Never  . Sexual activity: Not on file  Other Topics Concern  . Not on file  Social History Narrative  . Not on file   Social Determinants of Health   Financial Resource Strain:   . Difficulty of Paying Living Expenses: Not on file  Food Insecurity:   . Worried About Charity fundraiser in the Last Year: Not on file  . Ran Out of Food in the Last Year: Not on file  Transportation Needs:   . Lack of Transportation (Medical): Not on file  . Lack of Transportation (Non-Medical): Not on file  Physical Activity:   . Days of Exercise per Week: Not on file  . Minutes of Exercise per Session: Not on file  Stress:   . Feeling of Stress : Not on file  Social Connections:   . Frequency of Communication with Friends and Family: Not on file  . Frequency of Social Gatherings with Friends and Family: Not on file  . Attends Religious Services: Not on file  . Active Member of Clubs or Organizations: Not on file  . Attends Archivist Meetings: Not on file  . Marital Status: Not on file     FAMILY HISTORY:  We obtained a detailed, 4-generation family history.  Significant diagnoses are listed below: Family History  Problem Relation Age of Onset  . Prostate cancer Father 12  . Breast cancer Maternal Aunt 60  . Breast cancer Maternal  Grandmother        dx in her 27s  . Colon cancer Mother 34  . Lung cancer Maternal Uncle        smoker  . Prostate cancer Brother 68    The patient has a son and two daughters who are cancer free.  She has three brothers and one sister.  One brother was diagnosed with prostate cancer at 61.  Both parents are deceased.   The patient's father was diagnosed with prostate cancer at 55.  He had many siblings, but the patient is not close with the family.  She is not aware of cancer diagnoses in the family.  The patient's mother may have had colon cancer when she died at 73. She had a brother and sister, the brother was a smoker and had lung cancer and the sister had breast cancer at 60.  The patient's maternal grandparents are deceased.  The grandmother had breast cancer and died at 30  and the grandfather had a heart attack.  Ms. Lewis is unaware of previous family history of genetic testing for hereditary cancer risks. Patient's maternal ancestors are of Caucasian descent, and paternal ancestors are of [place] descent. There is no reported Ashkenazi Jewish ancestry. There is no known consanguinity.  GENETIC COUNSELING ASSESSMENT: Ms. Munley is a 64 y.o. female with a personal and family history of cancer which is somewhat suggestive of a hereditary cancer syndrome and predisposition to cancer given the number of women in the family with breast cancer and the combination of breast and prostate cancer. We, therefore, discussed and recommended the following at today's visit.   DISCUSSION: We discussed that 5 - 10% of breast cancer is hereditary, with most cases associated with BRCA mutations.  There are other genes that can be associated with hereditary breast cancer syndromes.  These include ATM, CHEK2 and PALB2.  We discussed that testing is beneficial for several reasons including knowing how to follow individuals after completing their treatment, and understand if other family members could be at risk for  cancer and allow them to undergo genetic testing.   We reviewed the characteristics, features and inheritance patterns of hereditary cancer syndromes. We also discussed genetic testing, including the appropriate family members to test, the process of testing, insurance coverage and turn-around-time for results. We discussed the implications of a negative, positive and/or variant of uncertain significant result. In order to get genetic test results in a timely manner so that Ms. Boulais can use these genetic test results for surgical decisions, we recommended Ms. Petz pursue genetic testing for the 9-gene STAT panel. Once complete, we recommend Ms. Penninger pursue reflex genetic testing to the common hereditary cancer gene panel. The Common Hereditary Gene Panel offered by [facility] includes sequencing and/or deletion duplication testing of the following 48 genes: APC, ATM, AXIN2, BARD1, BMPR1A, BRCA1, BRCA2, BRIP1, CDH1, CDK4, CDKN2A (p14ARF), CDKN2A (p16INK4a), CHEK2, CTNNA1, DICER1, EPCAM (Deletion/duplication testing only), GREM1 (promoter region deletion/duplication testing only), KIT, MEN1, MLH1, MSH2, MSH3, MSH6, MUTYH, NBN, NF1, NHTL1, PALB2, PDGFRA, PMS2, POLD1, POLE, PTEN, RAD50, RAD51C, RAD51D, RNF43, SDHB, SDHC, SDHD, SMAD4, SMARCA4. STK11, TP53, TSC1, TSC2, and VHL.  The following genes were evaluated for sequence changes only: SDHA and HOXB13 c.251G>A variant only.   Based on Ms. Gubser's personal and family history of cancer, she meets medical criteria for genetic testing. Despite that she meets criteria, she may still have an out of pocket cost.   PLAN: After considering the risks, benefits, and limitations, Ms. Melick provided informed consent to pursue genetic testing and the blood sample was sent to [facility] for analysis of the STAT and Common hereditary cancer panel. Results should be available within approximately 2-3 weeks' time, at which point they will be disclosed by telephone to Ms.  Wisnewski, as will any additional recommendations warranted by these results. Ms. Ayuso will receive a summary of her genetic counseling visit and a copy of her results once available. This information will also be available in Epic.   Lastly, we encouraged Ms. Beane to remain in contact with cancer genetics annually so that we can continuously update the family history and inform her of any changes in cancer genetics and testing that may be of benefit for this family.   Ms. Schweigert questions were answered to her satisfaction today. Our contact information was provided should additional questions or concerns arise. Thank you for the referral and allowing [place] to share in the care of your patient.  Yohannes Waibel P. Florene Glen, [place], [facility] Licensed, Insurance risk surveyor [email or web address] phone: [phone]  The patient was seen for a total of 45 minutes in face-to-face genetic counseling.  This patient was discussed with Drs. Magrinat, Lindi Adie and/or Burr Medico who agrees with the above.    _______________________________________________________________________ For Office Staff:  Number of people involved in session: 1 Was an Intern/ student involved with case: no

## 2019-11-12 ENCOUNTER — Other Ambulatory Visit: Payer: Self-pay

## 2019-11-12 ENCOUNTER — Encounter (HOSPITAL_BASED_OUTPATIENT_CLINIC_OR_DEPARTMENT_OTHER): Payer: Self-pay | Admitting: General Surgery

## 2019-11-13 ENCOUNTER — Ambulatory Visit: Payer: 59 | Attending: Internal Medicine

## 2019-11-13 DIAGNOSIS — Z23 Encounter for immunization: Secondary | ICD-10-CM

## 2019-11-13 NOTE — Progress Notes (Signed)
   Covid-19 Vaccination Clinic  Name:  Sierra Lara    MRN: [id number] DOB: 08-22-1955  11/13/2019  [name] was observed post Covid-19 immunization for 15 minutes without incident. She was provided with Vaccine Information Sheet and instruction to access the V-Safe system.   [name] was instructed to call 911 with any severe reactions post vaccine: Marland Kitchen Difficulty breathing  . Swelling of face and throat  . A fast heartbeat  . A bad rash all over body  . Dizziness and weakness

## 2019-11-16 ENCOUNTER — Other Ambulatory Visit (HOSPITAL_COMMUNITY)
Admission: RE | Admit: 2019-11-16 | Discharge: 2019-11-16 | Disposition: A | Discharge status: Home / Self Care | Payer: 59 | Source: Ambulatory Visit | Attending: General Surgery | Admitting: General Surgery

## 2019-11-16 ENCOUNTER — Encounter (HOSPITAL_BASED_OUTPATIENT_CLINIC_OR_DEPARTMENT_OTHER)
Admission: RE | Admit: 2019-11-16 | Discharge: 2019-11-16 | Disposition: A | Discharge status: Home / Self Care | Payer: 59 | Source: Ambulatory Visit | Attending: General Surgery | Admitting: General Surgery

## 2019-11-16 DIAGNOSIS — Z20822 Contact with and (suspected) exposure to covid-19: Secondary | ICD-10-CM | POA: Insufficient documentation

## 2019-11-16 DIAGNOSIS — I10 Essential (primary) hypertension: Secondary | ICD-10-CM | POA: Insufficient documentation

## 2019-11-16 DIAGNOSIS — Z01818 Encounter for other preprocedural examination: Secondary | ICD-10-CM | POA: Insufficient documentation

## 2019-11-16 DIAGNOSIS — Z01810 Encounter for preprocedural cardiovascular examination: Secondary | ICD-10-CM | POA: Insufficient documentation

## 2019-11-16 LAB — SARS CORONAVIRUS 2 (TAT 6-24 HRS): SARS Coronavirus 2: NEGATIVE

## 2019-11-16 NOTE — Progress Notes (Signed)

## 2019-11-18 ENCOUNTER — Ambulatory Visit
Admission: RE | Admit: 2019-11-18 | Discharge: 2019-11-18 | Disposition: A | Discharge status: Home / Self Care | Payer: 59 | Source: Ambulatory Visit | Attending: General Surgery | Admitting: General Surgery

## 2019-11-18 ENCOUNTER — Other Ambulatory Visit: Payer: Self-pay

## 2019-11-18 ENCOUNTER — Other Ambulatory Visit: Payer: Self-pay | Admitting: General Surgery

## 2019-11-18 ENCOUNTER — Encounter: Payer: Self-pay | Admitting: Genetic Counselor

## 2019-11-18 ENCOUNTER — Telehealth: Payer: Self-pay | Admitting: Genetic Counselor

## 2019-11-18 DIAGNOSIS — C50212 Malignant neoplasm of upper-inner quadrant of left female breast: Secondary | ICD-10-CM

## 2019-11-18 DIAGNOSIS — Z170 Estrogen receptor positive status [ER+]: Secondary | ICD-10-CM

## 2019-11-18 DIAGNOSIS — Z1379 Encounter for other screening for genetic and chromosomal anomalies: Secondary | ICD-10-CM | POA: Insufficient documentation

## 2019-11-18 NOTE — Telephone Encounter (Signed)
Revealed negative genetic testing.  Discussed that we do not know why she has breast cancer or why there is cancer in the family. It could be due to a different gene that we are not testing, or maybe our current technology may not be able to pick something up.  It will be important for her to keep in contact with genetics to keep up with whether additional testing may be needed. 

## 2019-11-19 ENCOUNTER — Encounter (HOSPITAL_BASED_OUTPATIENT_CLINIC_OR_DEPARTMENT_OTHER): Payer: Self-pay | Admitting: General Surgery

## 2019-11-19 ENCOUNTER — Other Ambulatory Visit: Payer: Self-pay

## 2019-11-19 ENCOUNTER — Ambulatory Visit (HOSPITAL_COMMUNITY)
Admission: RE | Admit: 2019-11-19 | Discharge: 2019-11-19 | Disposition: A | Discharge status: Home / Self Care | Payer: 59 | Source: Ambulatory Visit | Attending: General Surgery | Admitting: General Surgery

## 2019-11-19 ENCOUNTER — Ambulatory Visit (HOSPITAL_BASED_OUTPATIENT_CLINIC_OR_DEPARTMENT_OTHER): Payer: 59 | Admitting: Anesthesiology

## 2019-11-19 ENCOUNTER — Ambulatory Visit
Admission: RE | Admit: 2019-11-19 | Discharge: 2019-11-19 | Disposition: A | Discharge status: Home / Self Care | Payer: 59 | Source: Ambulatory Visit | Attending: General Surgery | Admitting: General Surgery

## 2019-11-19 ENCOUNTER — Encounter (HOSPITAL_BASED_OUTPATIENT_CLINIC_OR_DEPARTMENT_OTHER)
Admission: RE | Disposition: A | Discharge status: Home / Self Care | Payer: Self-pay | Source: Home / Self Care | Attending: General Surgery

## 2019-11-19 ENCOUNTER — Ambulatory Visit (HOSPITAL_BASED_OUTPATIENT_CLINIC_OR_DEPARTMENT_OTHER)
Admission: RE | Admit: 2019-11-19 | Discharge: 2019-11-19 | Disposition: A | Discharge status: Home / Self Care | Payer: 59 | Attending: General Surgery | Admitting: General Surgery

## 2019-11-19 DIAGNOSIS — C50212 Malignant neoplasm of upper-inner quadrant of left female breast: Secondary | ICD-10-CM | POA: Diagnosis present

## 2019-11-19 DIAGNOSIS — Z803 Family history of malignant neoplasm of breast: Secondary | ICD-10-CM | POA: Insufficient documentation

## 2019-11-19 DIAGNOSIS — D0502 Lobular carcinoma in situ of left breast: Secondary | ICD-10-CM | POA: Insufficient documentation

## 2019-11-19 DIAGNOSIS — Z8042 Family history of malignant neoplasm of prostate: Secondary | ICD-10-CM | POA: Diagnosis not present

## 2019-11-19 DIAGNOSIS — D360 Benign neoplasm of lymph nodes: Secondary | ICD-10-CM | POA: Diagnosis not present

## 2019-11-19 DIAGNOSIS — Z170 Estrogen receptor positive status [ER+]: Secondary | ICD-10-CM

## 2019-11-19 HISTORY — DX: Other specified postprocedural states: R11.2

## 2019-11-19 HISTORY — PX: BREAST LUMPECTOMY: SHX2

## 2019-11-19 HISTORY — PX: BREAST LUMPECTOMY WITH RADIOACTIVE SEED AND SENTINEL LYMPH NODE BIOPSY: SHX6550

## 2019-11-19 HISTORY — DX: Other specified postprocedural states: Z98.890

## 2019-11-19 SURGERY — BREAST LUMPECTOMY WITH RADIOACTIVE SEED AND SENTINEL LYMPH NODE BIOPSY
Anesthesia: General | Site: Breast | Laterality: Left

## 2019-11-19 MED ORDER — BUPIVACAINE HCL (PF) 0.25 % IJ SOLN
INTRAMUSCULAR | Status: DC | PRN
  Administered 2019-11-19: 20 mL via EPIDURAL

## 2019-11-19 MED ORDER — SUCCINYLCHOLINE CHLORIDE 20 MG/ML IJ SOLN
INTRAMUSCULAR | Status: DC | PRN
  Administered 2019-11-19: 160 mg via INTRAVENOUS

## 2019-11-19 MED ORDER — HYDROCODONE-ACETAMINOPHEN 5-325 MG PO TABS: 1.0000 | ORAL_TABLET | Freq: Four times a day (QID) | ORAL | 0 refills | Status: DC | PRN

## 2019-11-19 MED ORDER — FENTANYL CITRATE (PF) 100 MCG/2ML IJ SOLN
INTRAMUSCULAR | Status: AC
  Filled 2019-11-19: qty 2

## 2019-11-19 MED ORDER — EPHEDRINE SULFATE 50 MG/ML IJ SOLN
INTRAMUSCULAR | Status: DC | PRN
  Administered 2019-11-19: 10 mg via INTRAVENOUS

## 2019-11-19 MED ORDER — FENTANYL CITRATE (PF) 100 MCG/2ML IJ SOLN
25.0000 ug | INTRAMUSCULAR | Status: DC | PRN
  Administered 2019-11-19: 25 ug via INTRAVENOUS

## 2019-11-19 MED ORDER — CHLORHEXIDINE GLUCONATE CLOTH 2 % EX PADS: 6.0000 | MEDICATED_PAD | Freq: Once | CUTANEOUS | Status: DC

## 2019-11-19 MED ORDER — ACETAMINOPHEN 500 MG PO TABS: 1000.0000 mg | ORAL_TABLET | Freq: Once | ORAL | Status: DC

## 2019-11-19 MED ORDER — DIPHENHYDRAMINE HCL 50 MG/ML IJ SOLN
INTRAMUSCULAR | Status: DC | PRN
  Administered 2019-11-19: 6.25 mg via INTRAVENOUS

## 2019-11-19 MED ORDER — LACTATED RINGERS IV SOLN: INTRAVENOUS | Status: DC

## 2019-11-19 MED ORDER — EPHEDRINE 5 MG/ML INJ
INTRAVENOUS | Status: AC
  Filled 2019-11-19: qty 10

## 2019-11-19 MED ORDER — ACETAMINOPHEN 500 MG PO TABS
1000.0000 mg | ORAL_TABLET | ORAL | Status: AC
  Administered 2019-11-19: 1000 mg via ORAL

## 2019-11-19 MED ORDER — DEXAMETHASONE SODIUM PHOSPHATE 10 MG/ML IJ SOLN
INTRAMUSCULAR | Status: AC
  Filled 2019-11-19: qty 1

## 2019-11-19 MED ORDER — ONDANSETRON HCL 4 MG/2ML IJ SOLN
INTRAMUSCULAR | Status: AC
  Filled 2019-11-19: qty 2

## 2019-11-19 MED ORDER — SUCCINYLCHOLINE CHLORIDE 200 MG/10ML IV SOSY
PREFILLED_SYRINGE | INTRAVENOUS | Status: AC
  Filled 2019-11-19: qty 10

## 2019-11-19 MED ORDER — PHENYLEPHRINE 40 MCG/ML (10ML) SYRINGE FOR IV PUSH (FOR BLOOD PRESSURE SUPPORT)
PREFILLED_SYRINGE | INTRAVENOUS | Status: AC
  Filled 2019-11-19: qty 10

## 2019-11-19 MED ORDER — ONDANSETRON HCL 4 MG/2ML IJ SOLN
INTRAMUSCULAR | Status: DC | PRN
  Administered 2019-11-19 (×2): 4 mg via INTRAVENOUS

## 2019-11-19 MED ORDER — DIPHENHYDRAMINE HCL 50 MG/ML IJ SOLN
INTRAMUSCULAR | Status: AC
  Filled 2019-11-19: qty 1

## 2019-11-19 MED ORDER — TECHNETIUM TC 99M TILMANOCEPT KIT
1.0000 | PACK | Freq: Once | INTRAVENOUS | Status: AC | PRN
  Administered 2019-11-19: 1 via INTRADERMAL

## 2019-11-19 MED ORDER — BUPIVACAINE LIPOSOME 1.3 % IJ SUSP
INTRAMUSCULAR | Status: DC | PRN
  Administered 2019-11-19: 10 mL

## 2019-11-19 MED ORDER — PROPOFOL 500 MG/50ML IV EMUL
INTRAVENOUS | Status: AC
  Filled 2019-11-19: qty 50

## 2019-11-19 MED ORDER — GABAPENTIN 300 MG PO CAPS
300.0000 mg | ORAL_CAPSULE | ORAL | Status: AC
  Administered 2019-11-19: 300 mg via ORAL

## 2019-11-19 MED ORDER — PROPOFOL 10 MG/ML IV BOLUS
INTRAVENOUS | Status: DC | PRN
  Administered 2019-11-19: 150 mg via INTRAVENOUS

## 2019-11-19 MED ORDER — CEFAZOLIN SODIUM-DEXTROSE 2-4 GM/100ML-% IV SOLN
INTRAVENOUS | Status: AC
  Filled 2019-11-19: qty 100

## 2019-11-19 MED ORDER — MIDAZOLAM HCL 2 MG/2ML IJ SOLN
INTRAMUSCULAR | Status: AC
  Filled 2019-11-19: qty 2

## 2019-11-19 MED ORDER — CELECOXIB 200 MG PO CAPS
ORAL_CAPSULE | ORAL | Status: AC
  Filled 2019-11-19: qty 1

## 2019-11-19 MED ORDER — MIDAZOLAM HCL 5 MG/5ML IJ SOLN
INTRAMUSCULAR | Status: DC | PRN
  Administered 2019-11-19: 1 mg via INTRAVENOUS

## 2019-11-19 MED ORDER — BUPIVACAINE-EPINEPHRINE 0.25% -1:200000 IJ SOLN
INTRAMUSCULAR | Status: DC | PRN
  Administered 2019-11-19: 20 mL

## 2019-11-19 MED ORDER — ACETAMINOPHEN 500 MG PO TABS
ORAL_TABLET | ORAL | Status: AC
  Filled 2019-11-19: qty 2

## 2019-11-19 MED ORDER — CELECOXIB 200 MG PO CAPS: 200.0000 mg | ORAL_CAPSULE | Freq: Once | ORAL | Status: DC

## 2019-11-19 MED ORDER — MIDAZOLAM HCL 2 MG/2ML IJ SOLN
2.0000 mg | Freq: Once | INTRAMUSCULAR | Status: AC
  Administered 2019-11-19: 2 mg via INTRAVENOUS

## 2019-11-19 MED ORDER — LIDOCAINE 2% (20 MG/ML) 5 ML SYRINGE
INTRAMUSCULAR | Status: AC
  Filled 2019-11-19: qty 5

## 2019-11-19 MED ORDER — GABAPENTIN 300 MG PO CAPS
ORAL_CAPSULE | ORAL | Status: AC
  Filled 2019-11-19: qty 1

## 2019-11-19 MED ORDER — FENTANYL CITRATE (PF) 100 MCG/2ML IJ SOLN
INTRAMUSCULAR | Status: DC | PRN
  Administered 2019-11-19: 50 ug via INTRAVENOUS

## 2019-11-19 MED ORDER — PROMETHAZINE HCL 25 MG/ML IJ SOLN: 6.2500 mg | INTRAMUSCULAR | Status: DC | PRN

## 2019-11-19 MED ORDER — FENTANYL CITRATE (PF) 100 MCG/2ML IJ SOLN
100.0000 ug | Freq: Once | INTRAMUSCULAR | Status: AC
  Administered 2019-11-19: 100 ug via INTRAVENOUS

## 2019-11-19 MED ORDER — DEXAMETHASONE SODIUM PHOSPHATE 4 MG/ML IJ SOLN
INTRAMUSCULAR | Status: DC | PRN
  Administered 2019-11-19: 10 mg via INTRAVENOUS

## 2019-11-19 MED ORDER — CEFAZOLIN SODIUM-DEXTROSE 2-4 GM/100ML-% IV SOLN
2.0000 g | INTRAVENOUS | Status: AC
  Administered 2019-11-19: 2 g via INTRAVENOUS

## 2019-11-19 MED ORDER — CELECOXIB 200 MG PO CAPS
200.0000 mg | ORAL_CAPSULE | ORAL | Status: AC
  Administered 2019-11-19: 200 mg via ORAL

## 2019-11-19 MED ORDER — PHENYLEPHRINE HCL (PRESSORS) 10 MG/ML IV SOLN
INTRAVENOUS | Status: DC | PRN
  Administered 2019-11-19 (×3): 80 ug via INTRAVENOUS

## 2019-11-19 MED ORDER — SCOPOLAMINE 1 MG/3DAYS TD PT72
MEDICATED_PATCH | TRANSDERMAL | Status: AC
  Filled 2019-11-19: qty 1

## 2019-11-19 MED ORDER — PROPOFOL 500 MG/50ML IV EMUL
INTRAVENOUS | Status: DC | PRN
  Administered 2019-11-19: 25 ug/kg/min via INTRAVENOUS

## 2019-11-19 MED ORDER — SCOPOLAMINE 1 MG/3DAYS TD PT72
1.0000 | MEDICATED_PATCH | TRANSDERMAL | Status: DC
  Administered 2019-11-19: 1.5 mg via TRANSDERMAL

## 2019-11-19 SURGICAL SUPPLY — 43 items
ADH SKN CLS APL DERMABOND .7 (GAUZE/BANDAGES/DRESSINGS) ×1
APL PRP STRL LF DISP 70% ISPRP (MISCELLANEOUS) ×1
APPLIER CLIP 9.375 MED OPEN (MISCELLANEOUS)
APR CLP MED 9.3 20 MLT OPN (MISCELLANEOUS)
BLADE SURG 15 STRL LF DISP TIS (BLADE) ×1 IMPLANT
BLADE SURG 15 STRL SS (BLADE) ×2
CANISTER SUC SOCK COL 7IN (MISCELLANEOUS) IMPLANT
CANISTER SUCT 1200ML W/VALVE (MISCELLANEOUS) IMPLANT
CHLORAPREP W/TINT 26 (MISCELLANEOUS) ×2 IMPLANT
CLIP APPLIE 9.375 MED OPEN (MISCELLANEOUS) IMPLANT
COVER BACK TABLE 60X90IN (DRAPES) ×2 IMPLANT
COVER MAYO STAND STRL (DRAPES) ×2 IMPLANT
COVER WAND RF STERILE (DRAPES) IMPLANT
DECANTER SPIKE VIAL GLASS SM (MISCELLANEOUS) IMPLANT
DERMABOND ADVANCED (GAUZE/BANDAGES/DRESSINGS) ×1
DERMABOND ADVANCED .7 DNX12 (GAUZE/BANDAGES/DRESSINGS) ×1 IMPLANT
DRAPE GAMMA PROBE CRDLSS 10X38 (DRAPES) ×2 IMPLANT
DRAPE LAPAROSCOPIC ABDOMINAL (DRAPES) ×2 IMPLANT
DRAPE UTILITY XL STRL (DRAPES) ×2 IMPLANT
ELECT COATED BLADE 2.86 ST (ELECTRODE) ×2 IMPLANT
ELECT REM PT RETURN 9FT ADLT (ELECTROSURGICAL) ×2
ELECTRODE REM PT RTRN 9FT ADLT (ELECTROSURGICAL) ×1 IMPLANT
GLOVE BIO SURGEON STRL SZ7.5 (GLOVE) ×2 IMPLANT
GOWN STRL REUS W/ TWL LRG LVL3 (GOWN DISPOSABLE) ×2 IMPLANT
GOWN STRL REUS W/TWL LRG LVL3 (GOWN DISPOSABLE) ×4
ILLUMINATOR WAVEGUIDE N/F (MISCELLANEOUS) IMPLANT
KIT MARKER MARGIN INK (KITS) ×2 IMPLANT
LIGHT WAVEGUIDE WIDE FLAT (MISCELLANEOUS) IMPLANT
NDL SAFETY ECLIPSE 18X1.5 (NEEDLE) ×1 IMPLANT
NEEDLE HYPO 18GX1.5 SHARP (NEEDLE) ×2
NS IRRIG 1000ML POUR BTL (IV SOLUTION) IMPLANT
PACK BASIN DAY SURGERY FS (CUSTOM PROCEDURE TRAY) ×2 IMPLANT
PENCIL SMOKE EVACUATOR (MISCELLANEOUS) ×2 IMPLANT
SLEEVE SCD COMPRESS KNEE MED (MISCELLANEOUS) ×2 IMPLANT
SPONGE LAP 18X18 RF (DISPOSABLE) ×2 IMPLANT
SUT MON AB 4-0 PC3 18 (SUTURE) ×4 IMPLANT
SUT SILK 2 0 SH (SUTURE) IMPLANT
SUT VICRYL 3-0 CR8 SH (SUTURE) ×2 IMPLANT
SYR CONTROL 10ML LL (SYRINGE) ×2 IMPLANT
TOWEL GREEN STERILE FF (TOWEL DISPOSABLE) ×2 IMPLANT
TRAY FAXITRON CT DISP (TRAY / TRAY PROCEDURE) ×2 IMPLANT
TUBE CONNECTING 20X1/4 (TUBING) IMPLANT
YANKAUER SUCT BULB TIP NO VENT (SUCTIONS) IMPLANT

## 2019-11-19 NOTE — Interval H&P Note (Signed)
History and Physical Interval Note:  11/19/2019 10:07 AM  Sierra Lara  has presented today for surgery, with the diagnosis of LEFT BREAST CANCER.  The various methods of treatment have been discussed with the patient and family. After consideration of risks, benefits and other options for treatment, the patient has consented to  Procedure(s): LEFT BREAST LUMPECTOMY WITH RADIOACTIVE SEED AND SENTINEL LYMPH NODE BIOPSY (Left) as a surgical intervention.  The patient's history has been reviewed, patient examined, no change in status, stable for surgery.  I have reviewed the patient's chart and labs.  Questions were answered to the patient's satisfaction.     Autumn Messing III

## 2019-11-19 NOTE — Anesthesia Procedure Notes (Addendum)
Procedure Name: Intubation Date/Time: 11/19/2019 10:37 AM Performed by: Willa Frater, CRNA Pre-anesthesia Checklist: Patient identified, Emergency Drugs available, Suction available and Patient being monitored Patient Re-evaluated:Patient Re-evaluated prior to induction Oxygen Delivery Method: Circle system utilized Preoxygenation: Pre-oxygenation with 100% oxygen Induction Type: IV induction, Rapid sequence and Cricoid Pressure applied Ventilation: Mask ventilation without difficulty Laryngoscope Size: Mac and 3 Grade View: Grade II Tube type: Oral Number of attempts: 1 Airway Equipment and Method: Stylet and Oral airway Placement Confirmation: ETT inserted through vocal cords under direct vision,  positive ETCO2 and breath sounds checked- equal and bilateral Secured at: 23 cm Tube secured with: Tape Dental Injury: Teeth and Oropharynx as per pre-operative assessment  Difficulty Due To: Difficult Airway- due to anterior larynx

## 2019-11-19 NOTE — Anesthesia Procedure Notes (Signed)
Anesthesia Regional Block: Pectoralis block   Pre-Anesthetic Checklist: ,, timeout performed, Correct Patient, Correct Site, Correct Laterality, Correct Procedure, Correct Position, site marked, Risks and benefits discussed,  Surgical consent,  Pre-op evaluation,  At surgeon's request and post-op pain management  Laterality: Left  Prep: chloraprep       Needles:  Injection technique: Single-shot  Needle Type: Echogenic Stimulator Needle     Needle Length: 10cm  Needle Gauge: 21     Additional Needles:   Narrative:  Start time: 11/19/2019 9:54 AM End time: 11/19/2019 10:03 AM Injection made incrementally with aspirations every 5 mL.  Performed by: Personally

## 2019-11-19 NOTE — Anesthesia Postprocedure Evaluation (Signed)
Anesthesia Post Note  Patient: Sierra Lara  Procedure(s) Performed: LEFT BREAST LUMPECTOMY WITH RADIOACTIVE SEED AND SENTINEL LYMPH NODE BIOPSY (Left Breast)     Patient location during evaluation: PACU Anesthesia Type: General Level of consciousness: sedated Pain management: pain level controlled Vital Signs Assessment: post-procedure vital signs reviewed and stable Respiratory status: spontaneous breathing and respiratory function stable Cardiovascular status: stable Postop Assessment: no apparent nausea or vomiting Anesthetic complications: no   No complications documented.  Last Vitals:  Vitals:   11/19/19 1300 11/19/19 1330  BP: 121/71 120/62  Pulse: 88 90  Resp: 14 16  Temp:  37.1 C  SpO2: 95% 98%    Last Pain:  Vitals:   11/19/19 1330  TempSrc:   PainSc: 1                  Petra Sargeant DANIEL

## 2019-11-19 NOTE — Anesthesia Preprocedure Evaluation (Addendum)
Anesthesia Evaluation  Patient identified by MRN, date of birth, ID band Patient awake    Reviewed: Allergy & Precautions, NPO status , Patient's Chart, lab work & pertinent test results  History of Anesthesia Complications (+) PONV and history of anesthetic complications  Airway Mallampati: II  TM Distance: >3 FB Neck ROM: Full    Dental no notable dental hx. (+) Dental Advisory Given   Pulmonary neg pulmonary ROS,    Pulmonary exam normal        Cardiovascular hypertension, Pt. on medications Normal cardiovascular exam     Neuro/Psych negative neurological ROS     GI/Hepatic negative GI ROS, Neg liver ROS,   Endo/Other  negative endocrine ROS  Renal/GU negative Renal ROS     Musculoskeletal negative musculoskeletal ROS (+)   Abdominal   Peds  Hematology negative hematology ROS (+)   Anesthesia Other Findings   Reproductive/Obstetrics                            Anesthesia Physical Anesthesia Plan  ASA: II  Anesthesia Plan: General   Post-op Pain Management:  Regional for Post-op pain   Induction:   PONV Risk Score and Plan: 4 or greater and Ondansetron, Dexamethasone, Midazolam and Scopolamine patch - Pre-op  Airway Management Planned: LMA  Additional Equipment:   Intra-op Plan:   Post-operative Plan: Extubation in OR  Informed Consent: I have reviewed the patients History and Physical, chart, labs and discussed the procedure including the risks, benefits and alternatives for the proposed anesthesia with the patient or authorized representative who has indicated his/her understanding and acceptance.     Dental advisory given  Plan Discussed with: Anesthesiologist and CRNA  Anesthesia Plan Comments:        Anesthesia Quick Evaluation

## 2019-11-19 NOTE — Transfer of Care (Signed)
Immediate Anesthesia Transfer of Care Note  Patient: Sierra Lara  Procedure(s) Performed: LEFT BREAST LUMPECTOMY WITH RADIOACTIVE SEED AND SENTINEL LYMPH NODE BIOPSY (Left Breast)  Patient Location: PACU  Anesthesia Type:General  Level of Consciousness: sedated  Airway & Oxygen Therapy: Patient Spontanous Breathing and Patient connected to face mask oxygen  Post-op Assessment: Report given to RN and Post -op Vital signs reviewed and stable  Post vital signs: Reviewed and stable  Last Vitals:  Vitals Value Taken Time  BP    Temp    Pulse 99 11/19/19 1205  Resp    SpO2 99 % 11/19/19 1205  Vitals shown include unvalidated device data.  Last Pain:  Vitals:   11/19/19 1000  TempSrc:   PainSc: 0-No pain      Patients Stated Pain Goal: 3 (38/[id number])  Complications: No complications documented.

## 2019-11-19 NOTE — Op Note (Signed)
11/19/2019  11:44 AM  PATIENT:  Sierra Lara  64 y.o. female  PRE-OPERATIVE DIAGNOSIS:  LEFT BREAST CANCER  POST-OPERATIVE DIAGNOSIS:  LEFT BREAST CANCER  PROCEDURE:  Procedure(s): LEFT BREAST LUMPECTOMY WITH RADIOACTIVE SEED LOCALIZATION AND DEEP LEFT AXILLARY SENTINEL LYMPH NODE BIOPSY (Left)  SURGEON:  Surgeon(s) and Role:    * Jovita Kussmaul, MD - Primary  PHYSICIAN ASSISTANT:   ASSISTANTS: none   ANESTHESIA:   local and general  EBL:  Minimal   BLOOD ADMINISTERED:none  DRAINS: none   LOCAL MEDICATIONS USED:  MARCAINE     SPECIMEN:  Source of Specimen:  left breast tissue with additional medial/inferior margin and sentinel nodes x 3  DISPOSITION OF SPECIMEN:  PATHOLOGY  COUNTS:  YES  TOURNIQUET:  * No tourniquets in log *  DICTATION: .Dragon Dictation   After informed consent was obtained the patient was brought to the operating room and placed in the supine position on the operating table.  After adequate induction of general anesthesia the patient's left chest, breast, and axillary area were prepped with ChloraPrep, allowed to dry, and draped in usual sterile manner.  An appropriate timeout was performed.  Previously to I-125 seeds were placed in the upper inner quadrant to mark areas of invasive lobular cancer.  Also earlier in the day the patient underwent injection 1 mCi of technetium sulfur colloid in the subareolar position on the left.  The neoprobe was initially set to technetium and an area of radioactivity was readily identified in the left axilla.  This area was infiltrated with quarter percent Marcaine.  A small transversely oriented incision was made with a 15 blade knife.  The incision was carried through the skin and subcutaneous tissue sharply with the electrocautery until the deep left axillary space was entered.  The neoprobe was used to direct blunt hemostat dissection.  I was able to identify 3 hot lymph nodes.  These were excised sharply with the  electrocautery and the surrounding small vessels and lymphatics were controlled with clips.  Ex vivo counts on these nodes ranged from 30 to 120.  No other hot or palpable nodes were identified in the left axilla.  Hemostasis was achieved using the Bovie electrocautery.  The deep layer of the wound was closed with interrupted 3-0 Vicryl stitches.  The skin was closed with a running 4-0 Monocryl subcuticular stitch.  Attention was then turned to the left breast.  The neoprobe was set to I-125 in the area of radioactivity was readily identified.  Because of the size of the area involved in the location of the seeds I elected to make an elliptical incision in the skin overlying the area of radioactivity with a 15 blade knife.  The incision was carried through the skin and subcutaneous tissue sharply with the electrocautery.  Dissection was then carried around the 2 radioactive seeds while checking the area of radioactivity frequently.  This dissection was carried all the way to the chest wall.  Once the specimen was removed it was oriented with the appropriate paint colors.  A specimen radiograph was obtained that showed the clips and seeds to be within the specimen.  I did elect to take an additional medial and inferior margin and this was marked appropriately and sent to pathology as well.  Hemostasis was achieved using the Bovie electrocautery.  The wound was irrigated with saline and then infiltrated with more quarter percent Marcaine.  The cavity was marked with clips.  The deep layer of the wound  was then closed with layers of interrupted 3-0 Vicryl stitches.  The skin was closed with a running 4-0 Monocryl subcuticular stitch.  Dermabond dressings were applied.  The patient tolerated the procedure well.  At the end of the case all needle sponge and instrument counts were correct.  The patient was then awakened and taken to recovery in stable condition.  PLAN OF CARE: Discharge to home after PACU  PATIENT  DISPOSITION:  PACU - hemodynamically stable.   Delay start of Pharmacological VTE agent (>24hrs) due to surgical blood loss or risk of bleeding: not applicable

## 2019-11-19 NOTE — H&P (Signed)
Sierra Lara  Location: [facility] Patient #: [id number] DOB: 01-03-1956 Married / Language: English / Race: White Female   History of Present Illness  The patient is a 64 year old female who presents with breast cancer. We are asked to see the patient in consultation by Dr. Janace Litten to evaluate her for a new left breast cancer. The patient is a 64 year old white female who recently went for a routine screening mammogram. At that time she was found to have a 5 mm area of distortion in the upper inner quadrant of the left breast. The lymph nodes looked normal. The distortion was biopsied and came back as an invasive lobular cancer that was ER and PR positive and HER-2 negative with a Ki-67 2%. She does have a family history of breast cancer in her grandmother. She is otherwise in pretty good health and does not smoke. She denies any breast pain or discharge from the nipple. She did have a second biopsy performed at the breast center today because the first clip did not appear to be in the correct position.   Past Surgical History  Breast Biopsy  Left. Colon Polyp Removal - Colonoscopy  Colon Removal - Complete  Shoulder Surgery  Right.  Diagnostic Studies History  Colonoscopy  1-5 years ago Mammogram  within last year  Allergies  No Known Drug Allergies   Medication History  amLODIPine Besylate (2.5MG  Tablet, Oral) Active. Ezetimibe (10MG  Tablet, Oral) Active. Medications Reconciled  Social History  Alcohol use  Occasional alcohol use. Caffeine use  Coffee. No drug use   Family History  Cerebrovascular Accident  Father. Diabetes Mellitus  Father. Heart Disease  Brother, Father. Hypertension  Sister. Prostate Cancer  Father.  Pregnancy / Birth History  Age at menarche  44 years. Contraceptive History  Oral contraceptives. Gravida  3 Length (months) of breastfeeding  7-12 Maternal age  20-35 Para  3 Regular periods    Other Problems  High blood pressure  Hypercholesterolemia  Kidney Stone     Review of Systems  General Not Present- Appetite Loss, Chills, Fatigue, Fever, Night Sweats, Weight Gain and Weight Loss. Skin Not Present- Change in Wart/Mole, Dryness, Hives, Jaundice, New Lesions, Non-Healing Wounds, Rash and Ulcer. HEENT Present- Wears glasses/contact lenses. Not Present- Earache, Hearing Loss, Hoarseness, Nose Bleed, Oral Ulcers, Ringing in the Ears, Seasonal Allergies, Sinus Pain, Sore Throat, Visual Disturbances and Yellow Eyes. Cardiovascular Not Present- Chest Pain, Difficulty Breathing Lying Down, Leg Cramps, Palpitations, Rapid Heart Rate, Shortness of Breath and Swelling of Extremities. Musculoskeletal Not Present- Back Pain, Joint Pain, Joint Stiffness, Muscle Pain, Muscle Weakness and Swelling of Extremities. Neurological Not Present- Decreased Memory, Fainting, Headaches, Numbness, Seizures, Tingling, Tremor, Trouble walking and Weakness. Psychiatric Not Present- Anxiety, Bipolar, Change in Sleep Pattern, Depression, Fearful and Frequent crying. Endocrine Not Present- Cold Intolerance, Excessive Hunger, Hair Changes, Heat Intolerance, Hot flashes and New Diabetes. Hematology Not Present- Blood Thinners, Easy Bruising, Excessive bleeding, Gland problems, HIV and Persistent Infections.  Vitals Weight: 160.25 lb Height: 65in Body Surface Area: 1.8 m Body Mass Index: 26.67 kg/m  Temp.: 94.43F  Pulse: 98 (Regular)        Physical Exam  General Mental Status-Alert. General Appearance-Consistent with stated age. Hydration-Well hydrated. Voice-Normal.  Head and Neck Head-normocephalic, atraumatic with no lesions or palpable masses. Trachea-midline. Thyroid Gland Characteristics - normal size and consistency.  Eye Eyeball - Bilateral-Extraocular movements intact. Sclera/Conjunctiva - Bilateral-No scleral icterus.  Chest and Lung  Exam Chest and lung  exam reveals -quiet, even and easy respiratory effort with no use of accessory muscles and on auscultation, normal breath sounds, no adventitious sounds and normal vocal resonance. Inspection Chest Wall - Normal. Back - normal.  Breast Note: There is no palpable mass in either breast. There is no palpable axillary, supraclavicular, or cervical lymphadenopathy.   Cardiovascular Cardiovascular examination reveals -normal heart sounds, regular rate and rhythm with no murmurs and normal pedal pulses bilaterally.  Abdomen Inspection Inspection of the abdomen reveals - No Hernias. Skin - Scar - no surgical scars. Palpation/Percussion Palpation and Percussion of the abdomen reveal - Soft, Non Tender, No Rebound tenderness, No Rigidity (guarding) and No hepatosplenomegaly. Auscultation Auscultation of the abdomen reveals - Bowel sounds normal.  Neurologic Neurologic evaluation reveals -alert and oriented x 3 with no impairment of recent or remote memory. Mental Status-Normal.  Musculoskeletal Normal Exam - Left-Upper Extremity Strength Normal and Lower Extremity Strength Normal. Normal Exam - Right-Upper Extremity Strength Normal and Lower Extremity Strength Normal.  Lymphatic Head & Neck  General Head & Neck Lymphatics: Bilateral - Description - Normal. Axillary  General Axillary Region: Bilateral - Description - Normal. Tenderness - Non Tender. Femoral & Inguinal  Generalized Femoral & Inguinal Lymphatics: Bilateral - Description - Normal. Tenderness - Non Tender.    Assessment & Plan MALIGNANT NEOPLASM OF UPPER-INNER QUADRANT OF LEFT BREAST IN FEMALE, ESTROGEN RECEPTOR POSITIVE (C50.212) Impression: The patient appears to have a 5 mm area of invasive lobular cancer in the upper inner quadrant of the left breast with clinically negative nodes. I have discussed with her in detail the different options for treatment and at this point she favors  breast conservation which I feel is a very reasonable way of treating her cancer. She will also be a good candidate for sentinel node biopsy. I have discussed with her in detail the risks and benefits of the operation as well as some the technical aspects including the use of a radioactive seed for localization and she understands and wishes to proceed. We will also obtain an MRI study since it is a lobular cancer to make sure we are accurately estimating the area of involvement. I will also make a referral to medical and radiation oncology to discuss adjuvant therapy. We will also follow-up with the breast center about her second biopsy today. We will call her with the results of the MRI and then proceed accordingly. This patient encounter took 60 minutes today to perform the following: take history, perform exam, review outside records, interpret imaging, counsel the patient on their diagnosis and document encounter, findings & plan in the EHR Current Plans Referred to Oncology, for evaluation and follow up (Oncology). Routine. MRI, breast, w/o contrast material; bilateral(77047)(ACR 0 )(DSN [id number])(G-Code G1004(MG)) (Clinical Scenarios: No content available for this procedure)

## 2019-11-19 NOTE — Progress Notes (Signed)
Emotional support during Nuc Med injection.

## 2019-11-19 NOTE — Progress Notes (Signed)
Assisted Dr. Singer with left, ultrasound guided, pectoralis block. Side rails up, monitors on throughout procedure. See vital signs in flow sheet. Tolerated Procedure well. °

## 2019-11-19 NOTE — Discharge Instructions (Signed)
Information for Discharge Teaching: EXPAREL (bupivacaine liposome injectable suspension)   Your surgeon or anesthesiologist gave you EXPAREL(bupivacaine) to help control your pain after surgery.   EXPAREL is a local anesthetic that provides pain relief by numbing the tissue around the surgical site.  EXPAREL is designed to release pain medication over time and can control pain for up to 72 hours.  Depending on how you respond to EXPAREL, you may require less pain medication during your recovery.  Possible side effects:  Temporary loss of sensation or ability to move in the area where bupivacaine was injected.  Nausea, vomiting, constipation  Rarely, numbness and tingling in your mouth or lips, lightheadedness, or anxiety may occur.  Call your doctor right away if you think you may be experiencing any of these sensations, or if you have other questions regarding possible side effects.  Follow all other discharge instructions given to you by your surgeon or nurse. Eat a healthy diet and drink plenty of water or other fluids.  If you return to the hospital for any reason within 96 hours following the administration of EXPAREL, it is important for health care providers to know that you have received this anesthetic. A teal colored band has been placed on your arm with the date, time and amount of EXPAREL you have received in order to alert and inform your health care providers. Please leave this armband in place for the full 96 hours following administration, and then you may remove the band.Information for Discharge Teaching: EXPAREL (bupivacaine liposome injectable suspension)   Your surgeon or anesthesiologist gave you EXPAREL(bupivacaine) to help control your pain after surgery.   EXPAREL is a local anesthetic that provides pain relief by numbing the tissue around the surgical site.  EXPAREL is designed to release pain medication over time and can control pain for up to 72  hours.  Depending on how you respond to EXPAREL, you may require less pain medication during your recovery.  Possible side effects:  Temporary loss of sensation or ability to move in the area where bupivacaine was injected.  Nausea, vomiting, constipation  Rarely, numbness and tingling in your mouth or lips, lightheadedness, or anxiety may occur.  Call your doctor right away if you think you may be experiencing any of these sensations, or if you have other questions regarding possible side effects.  Follow all other discharge instructions given to you by your surgeon or nurse. Eat a healthy diet and drink plenty of water or other fluids.  If you return to the hospital for any reason within 96 hours following the administration of EXPAREL, it is important for health care providers to know that you have received this anesthetic. A teal colored band has been placed on your arm with the date, time and amount of EXPAREL you have received in order to alert and inform your health care providers. Please leave this armband in place for the full 96 hours following administration, and then you may remove the band. Post Anesthesia Home Care Instructions  Activity: Get plenty of rest for the remainder of the day. A responsible individual must stay with you for 24 hours following the procedure.  For the next 24 hours, DO NOT: -Drive a car -Paediatric nurse -Drink alcoholic beverages -Take any medication unless instructed by your physician -Make any legal decisions or sign important papers.  Meals: Start with liquid foods such as gelatin or soup. Progress to regular foods as tolerated. Avoid greasy, spicy, heavy foods. If nausea and/or vomiting  occur, drink only clear liquids until the nausea and/or vomiting subsides. Call your physician if vomiting continues.  Special Instructions/Symptoms: Your throat may feel dry or sore from the anesthesia or the breathing tube placed in your throat during  surgery. If this causes discomfort, gargle with warm salt water. The discomfort should disappear within 24 hours.  If you had a scopolamine patch placed behind your ear for the management of post- operative nausea and/or vomiting:  1. The medication in the patch is effective for 72 hours, after which it should be removed.  Wrap patch in a tissue and discard in the trash. Wash hands thoroughly with soap and water. 2. You may remove the patch earlier than 72 hours if you experience unpleasant side effects which may include dry mouth, dizziness or visual disturbances. 3. Avoid touching the patch. Wash your hands with soap and water after contact with the patch.     Do not take Tylenol or Ibuprofen until 3:30 pm.

## 2019-11-22 ENCOUNTER — Encounter (HOSPITAL_BASED_OUTPATIENT_CLINIC_OR_DEPARTMENT_OTHER): Payer: Self-pay | Admitting: General Surgery

## 2019-11-22 ENCOUNTER — Ambulatory Visit: Payer: Self-pay | Admitting: Genetic Counselor

## 2019-11-22 DIAGNOSIS — Z1379 Encounter for other screening for genetic and chromosomal anomalies: Secondary | ICD-10-CM

## 2019-11-22 NOTE — Progress Notes (Signed)
HPI:  Ms. Novakowski was previously seen in the [place] clinic due to a personal and family history of cancer and concerns regarding a hereditary predisposition to cancer. Please refer to our prior cancer genetics clinic note for more information regarding our discussion, assessment and recommendations, at the time. Ms. Brougher recent genetic test results were disclosed to her, as were recommendations warranted by these results. These results and recommendations are discussed in more detail below.  CANCER HISTORY:  Oncology History Overview Note  Cancer Staging Malignant neoplasm of upper-inner quadrant of left breast in female, estrogen receptor positive ([place]) Staging form: Breast, AJCC 8th Edition - Clinical stage from 09/22/2019: Stage IA (cT1a, cN0, cM0, G1, ER+, PR+, HER2-) - Signed by Truitt Merle, MD on 10/07/2019    Malignant neoplasm of upper-inner quadrant of left breast in female, estrogen receptor positive ([place])  09/08/2019 Mammogram   Left Diagnostic Mammogram  IMPRESSION 1.Suspicious 73m mass involving the UPPER OUTER QUADRANT of the LEFT breast at the 11:00 position approximately 6cmfn. This is the likely sonographic correlate for the screening mammographic architectural distortion.  2. No pathologic LEFT axillary lymphadnopathy.    09/13/2019 Initial Biopsy   Diagnosis  Breast, left, needle core biopsy, 11:00 position, 6cmfn -INVASIVE LOBULAR CARCINOMA, GRADE 2, AND LOBULAR CARCINOMA IN SITU -TUMOR INVOLVES ALL CORES AND MEASURES 7MM IN MAXIMUM EXTENT IN A SINGLE CORE.  -A BREAST PROGNOSTIC PROFILE WILL BE ORDERED ON BLOCK 1A AND SEPARATELY REPORTED -See comment    09/13/2019 Receptors her2   ER 90% positive, moderately staining intensity PR 80% positive, strong staining intensity  Proliferation Marker Ki67: 2%  HER2 Equivocal - [facility]  09/22/2019 Cancer Staging   Staging form: Breast, AJCC 8th Edition - Clinical stage from 09/22/2019: Stage IA  (cT1a, cN0, cM0, G1, ER+, PR+, HER2-) - Signed by FTruitt Merle MD on 10/07/2019   09/22/2019 Initial Biopsy   Diagnosis 09/22/19 Breast, left, needle core biopsy, uiq - INVASIVE MAMMARY CARCINOMA, SEE COMMENT. - MAMMARY CARCINOMA IN SITU. Microscopic Comment The carcinoma appears grade 1 and measures 3 mm in greatest linear extent. E-cadherin will be ordered. Prognostic makers will be ordered. Dr. KVic Ripperhas reviewed the case. The [facility] to [facility].   09/22/2019 Receptors her2   PROGNOSTIC INDICATORS Results: IMMUNOHISTOCHEMICAL AND MORPHOMETRIC ANALYSIS PERFORMED MANUALLY The tumor cells are NEGATIVE for Her2 (1+). Estrogen Receptor: 95%, POSITIVE, STRONG STAINING INTENSITY Progesterone Receptor: 2%, POSITIVE, STRONG STAINING INTENSITY Proliferation Marker Ki67: 15%    10/07/2019 Initial Diagnosis   Malignant neoplasm of upper-inner quadrant of left breast in female, estrogen receptor positive ([place]   11/16/2019 Genetic Testing   Negative genetic testing on the common hereditary cancer panel.  The Common Hereditary Gene Panel offered by [facility] includes sequencing and/or deletion duplication testing of the following 48 genes: APC, ATM, AXIN2, BARD1, BMPR1A, BRCA1, BRCA2, BRIP1, CDH1, CDK4, CDKN2A (p14ARF), CDKN2A (p16INK4a), CHEK2, CTNNA1, DICER1, EPCAM (Deletion/duplication testing only), GREM1 (promoter region deletion/duplication testing only), KIT, MEN1, MLH1, MSH2, MSH3, MSH6, MUTYH, NBN, NF1, NHTL1, PALB2, PDGFRA, PMS2, POLD1, POLE, PTEN, RAD50, RAD51C, RAD51D, RNF43, SDHB, SDHC, SDHD, SMAD4, SMARCA4. STK11, TP53, TSC1, TSC2, and VHL.  The following genes were evaluated for sequence changes only: SDHA and HOXB13 c.251G>A variant only.  The report date is November 16, 2019.     FAMILY HISTORY:  We obtained a detailed, 4-generation family history.  Significant diagnoses are listed below: Family History  Problem Relation Age of  Onset    Prostate cancer Father 33   Breast cancer Maternal Aunt 60   Breast cancer Maternal Grandmother        dx in her 44s   Colon cancer Mother 23   Lung cancer Maternal Uncle        smoker   Prostate cancer Brother 74    The patient has a son and two daughters who are cancer free.  She has three brothers and one sister.  One brother was diagnosed with prostate cancer at 86.  Both parents are deceased.   The patient's father was diagnosed with prostate cancer at 77.  He had many siblings, but the patient is not close with the family.  She is not aware of cancer diagnoses in the family.  The patient's mother may have had colon cancer when she died at 35. She had a brother and sister, the brother was a smoker and had lung cancer and the sister had breast cancer at 61.  The patient's maternal grandparents are deceased.  The grandmother had breast cancer and died at 35 and the grandfather had a heart attack.  Ms. Hilton is unaware of previous family history of genetic testing for hereditary cancer risks. Patient's maternal ancestors are of Caucasian descent, and paternal ancestors are of [place] descent. There is no reported Ashkenazi Jewish ancestry. There is no known consanguinity.    GENETIC TEST RESULTS: Genetic testing reported out on November 16, 2019 through the common hereditary cancer panel found no pathogenic mutations. The Common Hereditary Gene Panel offered by [facility] includes sequencing and/or deletion duplication testing of the following 48 genes: APC, ATM, AXIN2, BARD1, BMPR1A, BRCA1, BRCA2, BRIP1, CDH1, CDK4, CDKN2A (p14ARF), CDKN2A (p16INK4a), CHEK2, CTNNA1, DICER1, EPCAM (Deletion/duplication testing only), GREM1 (promoter region deletion/duplication testing only), KIT, MEN1, MLH1, MSH2, MSH3, MSH6, MUTYH, NBN, NF1, NHTL1, PALB2, PDGFRA, PMS2, POLD1, POLE, PTEN, RAD50, RAD51C, RAD51D, RNF43, SDHB, SDHC, SDHD, SMAD4, SMARCA4. STK11, TP53, TSC1, TSC2, and VHL.  The following genes were  evaluated for sequence changes only: SDHA and HOXB13 c.251G>A variant only. The test report has been scanned into EPIC and is located under the Molecular Pathology section of the Results Review tab.  A portion of the result report is included below for reference.     We discussed with Ms. Kellison that because current genetic testing is not perfect, it is possible there may be a gene mutation in one of these genes that current testing cannot detect, but that chance is small.  We also discussed, that there could be another gene that has not yet been discovered, or that we have not yet tested, that is responsible for the cancer diagnoses in the family. It is also possible there is a hereditary cause for the cancer in the family that Ms. Brundage did not inherit and therefore was not identified in her testing.  Therefore, it is important to remain in touch with cancer genetics in the future so that we can continue to offer Ms. Kauffmann the most up to date genetic testing.   ADDITIONAL GENETIC TESTING: We discussed with Ms. Jock that there are other genes that are associated with increased cancer risk that can be analyzed. Should Ms. Trevathan wish to pursue additional genetic testing, we are happy to discuss and coordinate this testing, at any time.    CANCER SCREENING RECOMMENDATIONS: Ms. Mcelhinny test result is considered negative (normal).  This means that we have not identified a hereditary cause for her personal and family history of  cancer at this time. Most cancers happen by chance and this negative test suggests that her cancer may fall into this category.    While reassuring, this does not definitively rule out a hereditary predisposition to cancer. It is still possible that there could be genetic mutations that are undetectable by current technology. There could be genetic mutations in genes that have not been tested or identified to increase cancer risk.  Therefore, it is recommended she continue to follow the cancer  management and screening guidelines provided by her oncology and primary healthcare provider.   An individual's cancer risk and medical management are not determined by genetic test results alone. Overall cancer risk assessment incorporates additional factors, including personal medical history, family history, and any available genetic information that may result in a personalized plan for cancer prevention and surveillance  RECOMMENDATIONS FOR FAMILY MEMBERS:  Individuals in this family might be at some increased risk of developing cancer, over the general population risk, simply due to the family history of cancer.  We recommended women in this family have a yearly mammogram beginning at age 39, or 57 years younger than the earliest onset of cancer, an annual clinical breast exam, and perform monthly breast self-exams. Women in this family should also have a gynecological exam as recommended by their primary provider. All family members should be referred for colonoscopy starting at age 27.  FOLLOW-UP: Lastly, we discussed with Ms. Rollyson that cancer genetics is a rapidly advancing field and it is possible that new genetic tests will be appropriate for her and/or her family members in the future. We encouraged her to remain in contact with cancer genetics on an annual basis so we can update her personal and family histories and let her know of advances in cancer genetics that may benefit this family.   Our contact number was provided. Ms. Albarracin questions were answered to her satisfaction, and she knows she is welcome to call us at anytime with additional questions or concerns.   Roma Kayser, [place], [facility] Licensed, Certified Genetic Counselor [email or web address]

## 2019-11-25 ENCOUNTER — Encounter: Payer: Self-pay | Admitting: *Deleted

## 2019-12-01 LAB — SURGICAL PATHOLOGY

## 2019-12-02 ENCOUNTER — Encounter: Payer: Self-pay | Admitting: *Deleted

## 2019-12-02 ENCOUNTER — Telehealth: Payer: Self-pay | Admitting: *Deleted

## 2019-12-02 NOTE — Telephone Encounter (Signed)
Received order for oncotype testing. Requisition faxed to pathology and [facility] °

## 2019-12-10 ENCOUNTER — Encounter: Payer: Self-pay | Admitting: Hematology

## 2019-12-13 ENCOUNTER — Encounter (HOSPITAL_COMMUNITY): Payer: Self-pay | Admitting: Hematology

## 2019-12-16 ENCOUNTER — Encounter: Payer: Self-pay | Admitting: *Deleted

## 2019-12-20 ENCOUNTER — Encounter: Payer: Self-pay | Admitting: *Deleted

## 2019-12-22 ENCOUNTER — Telehealth: Payer: Self-pay | Admitting: *Deleted

## 2019-12-22 ENCOUNTER — Encounter: Payer: Self-pay | Admitting: *Deleted

## 2019-12-22 NOTE — Telephone Encounter (Signed)
Received oncotype score of 20. Physician team notified. Called pt, left vm with results and chemo not recommended with next step of xrt. Contact information provided for questions or needs. Referral placed for Dr. Sondra Come.

## 2019-12-28 ENCOUNTER — Encounter (HOSPITAL_COMMUNITY): Payer: Self-pay | Admitting: Hematology

## 2019-12-28 ENCOUNTER — Encounter: Payer: Self-pay | Admitting: *Deleted

## 2019-12-28 DIAGNOSIS — Z170 Estrogen receptor positive status [ER+]: Secondary | ICD-10-CM

## 2019-12-28 DIAGNOSIS — C50212 Malignant neoplasm of upper-inner quadrant of left female breast: Secondary | ICD-10-CM

## 2019-12-29 ENCOUNTER — Ambulatory Visit
Admission: RE | Admit: 2019-12-29 | Discharge: 2019-12-29 | Disposition: A | Discharge status: Home / Self Care | Payer: 59 | Source: Ambulatory Visit | Attending: Radiation Oncology | Admitting: Radiation Oncology

## 2019-12-29 ENCOUNTER — Other Ambulatory Visit: Payer: Self-pay

## 2019-12-29 ENCOUNTER — Encounter: Payer: Self-pay | Admitting: Radiation Oncology

## 2019-12-29 DIAGNOSIS — Z170 Estrogen receptor positive status [ER+]: Secondary | ICD-10-CM | POA: Insufficient documentation

## 2019-12-29 DIAGNOSIS — Z79899 Other long term (current) drug therapy: Secondary | ICD-10-CM | POA: Diagnosis not present

## 2019-12-29 DIAGNOSIS — E7800 Pure hypercholesterolemia, unspecified: Secondary | ICD-10-CM | POA: Insufficient documentation

## 2019-12-29 DIAGNOSIS — C50212 Malignant neoplasm of upper-inner quadrant of left female breast: Secondary | ICD-10-CM

## 2019-12-29 NOTE — Progress Notes (Signed)
Radiation Oncology         [phone] ________________________________  Name: Sierra Lara MRN: [id number]  Date: 12/29/2019  DOB: 03-11-1955  Re-Evaluation Note  CC: [name], MD  Truitt Merle, MD    ICD-10-CM   1. Malignant neoplasm of upper-inner quadrant of left breast in female, estrogen receptor positive ([place])  C50.212    Z17.0     Diagnosis:  Stage IA (pT1c, pN0, pM0) Multifocal Left Breast, Invasive Lobular Carcinoma with LCIS, ER+ / PR+ / Her2-, Grade 1  Narrative:  The patient returns today to discuss radiation treatment options. She was seen in consultation on 10/20/2019. At that time, it was recommended that she proceed with breast conserving surgery followed by adjuvant radiation therapy.  Since consultation, she underwent a left breast lumpectomy with deep left axillary sentinel lymph node biopsy on 11/19/2019 under the care of Dr. Marlou Starks. Pathology from the procedure revealed invasive and in situ lobular carcinoma, two tumors (1.6 cm and 1.2 cm). The invasive lobular carcinoma was 0.2 cm from the posterior and superior margins. The lobular carcinoma in situ was 0.1 cm from the superior margin. Additional medial inferior margin excision was positive for focal lobular carcinoma in situ but no invasive carcinoma. The lobular carcinoma in situ was focally less than 0.1 cm from the final medial margin. Three left axillary lymph nodes were excised and all were negative for metastatic carcinoma.  Oncotype DX was obtained on the final surgical sample and the recurrence score of 20 predicted a risk of recurrence outside the breast over the next 9 years of 6%, if the patient's only systemic therapy is an antiestrogen for 5 years.  It also predicted no significant benefit from chemotherapy.  On review of systems, the patient reports tolerating her surgery very well.  She only took 1 Tylenol after her surgery. She denies swelling in her left arm or numbness and any other symptoms.    The patient has already started anastrozole and is tolerating this medication well.   Allergies:  has No Known Allergies.  Meds: Current Outpatient Medications  Medication Sig Dispense Refill   amLODipine (NORVASC) 2.5 MG tablet Take 2.5 mg by mouth daily.     anastrozole (ARIMIDEX) 1 MG tablet Take 1 tablet (1 mg total) by mouth daily. 30 tablet 6   ezetimibe (ZETIA) 10 MG tablet Take 10 mg by mouth daily.     No current facility-administered medications for this encounter.    Physical Findings: The patient is in no acute distress. Patient is alert and oriented.  height is '5\' 5"'  (1.651 m) and weight is 169 lb 9.6 oz (76.9 kg). Her temporal temperature is 97.2 F (36.2 C) (abnormal). Her blood pressure is 128/74 and her pulse is 73. Her respiration is 20 and oxygen saturation is 99%.  Lungs are clear to auscultation bilaterally. Heart has regular rate and rhythm. No palpable cervical, supraclavicular, or axillary adenopathy. Abdomen soft, non-tender, normal bowel sounds. Right breast: no palpable mass, nipple discharge or bleeding. Left breast: Well-healed scar in the upper inner quadrant of the breast.  A separate scar in the axillary area is also healed well without signs of drainage or infection  Lab Findings: No results found for: WBC, HGB, HCT, MCV, PLT  Radiographic Findings: No results found.  Impression:  Stage IA (pT1c, pN0, pM0) Multifocal Left Breast, Invasive Lobular Carcinoma with LCIS, ER+ / PR+ / Her2-, Grade 1  The patient would be an excellent candidate for breast conservation therapy  with radiation as a component of this plan.  I discussed with course of treatment side effects and potential toxicities of radiation therapy in the situation with the patient.  She appears to understand and wishes to proceed with planned course of treatment.  Plan:  Patient is scheduled for CT simulation tomorrow with treatments to begin in approximately a week.  She would be a  good candidate for hypofractionated accelerated radiation therapy over approximately 4 weeks..  Will use cardiac sparing techniques if necessary.  In light of the close surgical margins we will boost the lumpectomy cavity after her whole breast radiation therapy.  Total time spent in this encounter was 35 minutes which included reviewing the patient's most recent lumpectomy, pathology report, oncotype, physical examination, and documentation.  -----------------------------------  Blair Promise, PhD, MD  This document serves as a record of services personally performed by Gery Pray, MD. It was created on his behalf by Clerance Lav, a trained medical scribe. The creation of this record is based on the scribe's personal observations and the provider's statements to them. This document has been checked and approved by the attending provider.

## 2019-12-29 NOTE — Progress Notes (Signed)
Weight and vitals stable. Denies pain. Demonstrates full ROM of both upper extremities. Left breast lumpectomy surgical site well approximated without redness, drainage or edema. No evidence of lymphedema noted. Evaluated by Dr. Marlou Starks 2 weeks post op with intentions to follow up in six months. Oncotype score 20. Began arimidex prior to lumpectomy on 11/19/2019. Denies a history of radiation therapy. Denies having a pacemaker. Denies being pregnant.   BP 128/74   Pulse 73   Temp (!) 97.2 F (36.2 C) (Temporal)   Resp 20   Ht 5\' 5"  (1.651 m)   Wt 169 lb 9.6 oz (76.9 kg)   SpO2 99%   BMI 28.22 kg/m  Wt Readings from Last 3 Encounters:  12/29/19 169 lb 9.6 oz (76.9 kg)  11/19/19 164 lb 14.5 oz (74.8 kg)  10/20/19 163 lb 2 oz (74 kg)

## 2019-12-30 ENCOUNTER — Ambulatory Visit: Payer: 59 | Admitting: Radiation Oncology

## 2020-01-03 ENCOUNTER — Encounter: Payer: Self-pay | Admitting: *Deleted

## 2020-01-04 ENCOUNTER — Encounter: Payer: Self-pay | Admitting: Hematology

## 2020-01-04 ENCOUNTER — Telehealth: Payer: Self-pay | Admitting: Hematology

## 2020-01-04 NOTE — Telephone Encounter (Signed)
Scheduled appt per 12/20 sch msg - mailed reminder letter with appt date and time

## 2020-01-06 ENCOUNTER — Telehealth: Payer: Self-pay | Admitting: *Deleted

## 2020-01-06 NOTE — Telephone Encounter (Signed)
CALLED PATIENT TO INFORM OF [facility] APPT. BEING MOVED TO 01-11-20 - ARRIVAL TIME- 7:45 AM @ [place], SPOKE WITH PATIENT AND SHE AGREED TO THIS APPT. DATE AND TIME

## 2020-01-10 ENCOUNTER — Telehealth: Payer: Self-pay | Admitting: Radiation Oncology

## 2020-01-10 ENCOUNTER — Ambulatory Visit: Payer: 59 | Admitting: Radiation Oncology

## 2020-01-10 NOTE — Telephone Encounter (Signed)
Patient contacted our office to inform us that she was exposed to COVID-19 but she is fully vaccinated. Sierra Lara with [facility] of this and per her request, I advised the patient to get tested and let us know her results. Patient voiced understanding and will contact our office once she is tested and receives her result.

## 2020-01-11 ENCOUNTER — Ambulatory Visit: Payer: 59 | Admitting: Radiation Oncology

## 2020-01-12 ENCOUNTER — Ambulatory Visit: Payer: 59 | Admitting: Radiation Oncology

## 2020-01-17 ENCOUNTER — Ambulatory Visit: Payer: 59 | Admitting: Radiation Oncology

## 2020-01-17 ENCOUNTER — Ambulatory Visit
Admission: RE | Admit: 2020-01-17 | Discharge: 2020-01-17 | Disposition: A | Discharge status: Home / Self Care | Payer: 59 | Source: Ambulatory Visit | Attending: Radiation Oncology | Admitting: Radiation Oncology

## 2020-01-17 ENCOUNTER — Other Ambulatory Visit: Payer: Self-pay

## 2020-01-17 DIAGNOSIS — C50212 Malignant neoplasm of upper-inner quadrant of left female breast: Secondary | ICD-10-CM | POA: Diagnosis present

## 2020-01-17 DIAGNOSIS — Z170 Estrogen receptor positive status [ER+]: Secondary | ICD-10-CM | POA: Insufficient documentation

## 2020-01-18 ENCOUNTER — Ambulatory Visit: Payer: 59

## 2020-01-19 ENCOUNTER — Ambulatory Visit: Payer: 59 | Admitting: Radiation Oncology

## 2020-01-19 ENCOUNTER — Ambulatory Visit: Payer: 59

## 2020-01-20 ENCOUNTER — Ambulatory Visit: Payer: 59

## 2020-01-20 DIAGNOSIS — C50212 Malignant neoplasm of upper-inner quadrant of left female breast: Secondary | ICD-10-CM | POA: Diagnosis not present

## 2020-01-21 ENCOUNTER — Ambulatory Visit: Payer: 59

## 2020-01-24 ENCOUNTER — Other Ambulatory Visit: Payer: Self-pay

## 2020-01-24 ENCOUNTER — Ambulatory Visit
Admission: RE | Admit: 2020-01-24 | Discharge: 2020-01-24 | Disposition: A | Discharge status: Home / Self Care | Payer: 59 | Source: Ambulatory Visit | Attending: Radiation Oncology | Admitting: Radiation Oncology

## 2020-01-24 ENCOUNTER — Ambulatory Visit: Payer: 59

## 2020-01-24 DIAGNOSIS — C50212 Malignant neoplasm of upper-inner quadrant of left female breast: Secondary | ICD-10-CM

## 2020-01-24 DIAGNOSIS — Z170 Estrogen receptor positive status [ER+]: Secondary | ICD-10-CM

## 2020-01-25 ENCOUNTER — Ambulatory Visit
Admission: RE | Admit: 2020-01-25 | Discharge: 2020-01-25 | Disposition: A | Discharge status: Home / Self Care | Payer: 59 | Source: Ambulatory Visit | Attending: Radiation Oncology | Admitting: Radiation Oncology

## 2020-01-25 ENCOUNTER — Ambulatory Visit: Payer: 59

## 2020-01-25 DIAGNOSIS — C50212 Malignant neoplasm of upper-inner quadrant of left female breast: Secondary | ICD-10-CM | POA: Diagnosis not present

## 2020-01-26 ENCOUNTER — Ambulatory Visit: Payer: 59

## 2020-01-26 ENCOUNTER — Other Ambulatory Visit: Payer: Self-pay

## 2020-01-26 ENCOUNTER — Ambulatory Visit
Admission: RE | Admit: 2020-01-26 | Discharge: 2020-01-26 | Disposition: A | Discharge status: Home / Self Care | Payer: 59 | Source: Ambulatory Visit | Attending: Radiation Oncology | Admitting: Radiation Oncology

## 2020-01-26 DIAGNOSIS — C50212 Malignant neoplasm of upper-inner quadrant of left female breast: Secondary | ICD-10-CM | POA: Diagnosis not present

## 2020-01-27 ENCOUNTER — Ambulatory Visit: Payer: 59

## 2020-01-27 ENCOUNTER — Ambulatory Visit
Admission: RE | Admit: 2020-01-27 | Discharge: 2020-01-27 | Disposition: A | Discharge status: Home / Self Care | Payer: 59 | Source: Ambulatory Visit | Attending: Radiation Oncology | Admitting: Radiation Oncology

## 2020-01-27 DIAGNOSIS — C50212 Malignant neoplasm of upper-inner quadrant of left female breast: Secondary | ICD-10-CM | POA: Diagnosis not present

## 2020-01-28 ENCOUNTER — Ambulatory Visit: Payer: 59

## 2020-01-28 ENCOUNTER — Ambulatory Visit
Admission: RE | Admit: 2020-01-28 | Discharge: 2020-01-28 | Disposition: A | Discharge status: Home / Self Care | Payer: 59 | Source: Ambulatory Visit | Attending: Radiation Oncology | Admitting: Radiation Oncology

## 2020-01-28 ENCOUNTER — Other Ambulatory Visit: Payer: Self-pay

## 2020-01-28 DIAGNOSIS — C50212 Malignant neoplasm of upper-inner quadrant of left female breast: Secondary | ICD-10-CM | POA: Diagnosis not present

## 2020-01-31 ENCOUNTER — Ambulatory Visit: Payer: 59

## 2020-01-31 ENCOUNTER — Other Ambulatory Visit: Payer: Self-pay

## 2020-01-31 ENCOUNTER — Ambulatory Visit
Admission: RE | Admit: 2020-01-31 | Discharge: 2020-01-31 | Disposition: A | Discharge status: Home / Self Care | Payer: 59 | Source: Ambulatory Visit | Attending: Radiation Oncology | Admitting: Radiation Oncology

## 2020-01-31 DIAGNOSIS — C50212 Malignant neoplasm of upper-inner quadrant of left female breast: Secondary | ICD-10-CM | POA: Diagnosis not present

## 2020-02-01 ENCOUNTER — Ambulatory Visit: Payer: 59

## 2020-02-01 ENCOUNTER — Ambulatory Visit
Admission: RE | Admit: 2020-02-01 | Discharge: 2020-02-01 | Disposition: A | Discharge status: Home / Self Care | Payer: 59 | Source: Ambulatory Visit | Attending: Radiation Oncology | Admitting: Radiation Oncology

## 2020-02-01 ENCOUNTER — Ambulatory Visit: Payer: 59 | Admitting: Radiation Oncology

## 2020-02-01 ENCOUNTER — Other Ambulatory Visit: Payer: Self-pay

## 2020-02-01 DIAGNOSIS — C50212 Malignant neoplasm of upper-inner quadrant of left female breast: Secondary | ICD-10-CM | POA: Diagnosis not present

## 2020-02-02 ENCOUNTER — Ambulatory Visit
Admission: RE | Admit: 2020-02-02 | Discharge: 2020-02-02 | Disposition: A | Discharge status: Home / Self Care | Payer: 59 | Source: Ambulatory Visit | Attending: Radiation Oncology | Admitting: Radiation Oncology

## 2020-02-02 ENCOUNTER — Ambulatory Visit: Payer: 59

## 2020-02-02 DIAGNOSIS — C50212 Malignant neoplasm of upper-inner quadrant of left female breast: Secondary | ICD-10-CM | POA: Diagnosis not present

## 2020-02-03 ENCOUNTER — Ambulatory Visit: Payer: 59

## 2020-02-03 ENCOUNTER — Ambulatory Visit
Admission: RE | Admit: 2020-02-03 | Discharge: 2020-02-03 | Disposition: A | Discharge status: Home / Self Care | Payer: 59 | Source: Ambulatory Visit | Attending: Radiation Oncology | Admitting: Radiation Oncology

## 2020-02-03 DIAGNOSIS — C50212 Malignant neoplasm of upper-inner quadrant of left female breast: Secondary | ICD-10-CM | POA: Diagnosis not present

## 2020-02-04 ENCOUNTER — Ambulatory Visit
Admission: RE | Admit: 2020-02-04 | Discharge: 2020-02-04 | Disposition: A | Discharge status: Home / Self Care | Payer: 59 | Source: Ambulatory Visit | Attending: Radiation Oncology | Admitting: Radiation Oncology

## 2020-02-04 ENCOUNTER — Ambulatory Visit: Payer: 59

## 2020-02-04 ENCOUNTER — Other Ambulatory Visit: Payer: Self-pay

## 2020-02-04 DIAGNOSIS — C50212 Malignant neoplasm of upper-inner quadrant of left female breast: Secondary | ICD-10-CM | POA: Diagnosis not present

## 2020-02-05 ENCOUNTER — Ambulatory Visit: Payer: 59

## 2020-02-06 DIAGNOSIS — C50212 Malignant neoplasm of upper-inner quadrant of left female breast: Secondary | ICD-10-CM | POA: Diagnosis not present

## 2020-02-07 ENCOUNTER — Ambulatory Visit: Payer: 59

## 2020-02-07 ENCOUNTER — Ambulatory Visit
Admission: RE | Admit: 2020-02-07 | Discharge: 2020-02-07 | Disposition: A | Discharge status: Home / Self Care | Payer: 59 | Source: Ambulatory Visit | Attending: Radiation Oncology | Admitting: Radiation Oncology

## 2020-02-07 DIAGNOSIS — C50212 Malignant neoplasm of upper-inner quadrant of left female breast: Secondary | ICD-10-CM | POA: Diagnosis not present

## 2020-02-08 ENCOUNTER — Ambulatory Visit: Payer: 59

## 2020-02-08 ENCOUNTER — Ambulatory Visit: Payer: 59 | Admitting: Radiation Oncology

## 2020-02-08 ENCOUNTER — Ambulatory Visit
Admission: RE | Admit: 2020-02-08 | Discharge: 2020-02-08 | Disposition: A | Discharge status: Home / Self Care | Payer: 59 | Source: Ambulatory Visit | Attending: Radiation Oncology | Admitting: Radiation Oncology

## 2020-02-08 DIAGNOSIS — C50212 Malignant neoplasm of upper-inner quadrant of left female breast: Secondary | ICD-10-CM | POA: Diagnosis not present

## 2020-02-09 ENCOUNTER — Ambulatory Visit: Payer: 59

## 2020-02-09 ENCOUNTER — Ambulatory Visit
Admission: RE | Admit: 2020-02-09 | Discharge: 2020-02-09 | Disposition: A | Discharge status: Home / Self Care | Payer: 59 | Source: Ambulatory Visit | Attending: Radiation Oncology | Admitting: Radiation Oncology

## 2020-02-09 ENCOUNTER — Other Ambulatory Visit: Payer: Self-pay

## 2020-02-09 DIAGNOSIS — C50212 Malignant neoplasm of upper-inner quadrant of left female breast: Secondary | ICD-10-CM | POA: Diagnosis not present

## 2020-02-09 NOTE — Progress Notes (Signed)
[place]   [phone] [phone]   Clinic Follow up Note   Patient Care Team: [name], MD as PCP - General (Family Medicine) Truitt Merle, MD as Consulting Physician (Hematology) Jovita Kussmaul, MD as Consulting Physician (General Surgery) Mauro Kaufmann, RN as Oncology Nurse Navigator Rockwell Germany, RN as Oncology Nurse Navigator  Date of Service:  02/11/2020  CHIEF COMPLAINT: f/u of left breast cancer   SUMMARY OF ONCOLOGIC HISTORY: Oncology History Overview Note  Cancer Staging Malignant neoplasm of upper-inner quadrant of left breast in female, estrogen receptor positive ([place]) Staging form: Breast, AJCC 8th Edition - Clinical stage from 09/22/2019: Stage IA (cT1a, cN0, cM0, G1, ER+, PR+, HER2-) - Signed by Truitt Merle, MD on 10/07/2019    Malignant neoplasm of upper-inner quadrant of left breast in female, estrogen receptor positive ([place])  09/08/2019 Mammogram   Left Diagnostic Mammogram  IMPRESSION 1.Suspicious 29m mass involving the UPPER OUTER QUADRANT of the LEFT breast at the 11:00 position approximately 6cmfn. This is the likely sonographic correlate for the screening mammographic architectural distortion.  2. No pathologic LEFT axillary lymphadnopathy.    09/13/2019 Initial Biopsy   Diagnosis  Breast, left, needle core biopsy, 11:00 position, 6cmfn -INVASIVE LOBULAR CARCINOMA, GRADE 2, AND LOBULAR CARCINOMA IN SITU -TUMOR INVOLVES ALL CORES AND MEASURES 7MM IN MAXIMUM EXTENT IN A SINGLE CORE.  -A BREAST PROGNOSTIC PROFILE WILL BE ORDERED ON BLOCK 1A AND SEPARATELY REPORTED -See comment    09/13/2019 Receptors her2   ER 90% positive, moderately staining intensity PR 80% positive, strong staining intensity  Proliferation Marker Ki67: 2%  HER2 Equivocal - [facility]  09/22/2019 Cancer Staging   Staging form: Breast, AJCC 8th Edition - Clinical stage from 09/22/2019: Stage IA (cT1a, cN0, cM0, G1, ER+, PR+,  HER2-) - Signed by FTruitt Merle MD on 10/07/2019   09/22/2019 Initial Biopsy   Diagnosis 09/22/19 Breast, left, needle core biopsy, uiq - INVASIVE MAMMARY CARCINOMA, SEE COMMENT. - MAMMARY CARCINOMA IN SITU. Microscopic Comment The carcinoma appears grade 1 and measures 3 mm in greatest linear extent. E-cadherin will be ordered. Prognostic makers will be ordered. Dr. KVic Ripperhas reviewed the case. The case was called to [facility] 09/23/2019.   09/22/2019 Receptors her2   PROGNOSTIC INDICATORS Results: IMMUNOHISTOCHEMICAL AND MORPHOMETRIC ANALYSIS PERFORMED MANUALLY The tumor cells are NEGATIVE for Her2 (1+). Estrogen Receptor: 95%, POSITIVE, STRONG STAINING INTENSITY Progesterone Receptor: 2%, POSITIVE, STRONG STAINING INTENSITY Proliferation Marker Ki67: 15%    10/07/2019 Initial Diagnosis   Malignant neoplasm of upper-inner quadrant of left breast in female, estrogen receptor positive ([place]   10/08/2019 Imaging   MRI Breast  IMPRESSION: 1. 1.5 cm biopsy-proven invasive mammary carcinoma and mammary carcinoma in situ in the posterior aspect of the upper inner quadrant of the left breast. 2. 0.8 cm biopsy-proven invasive lobular carcinoma and lobular carcinoma in situ in the upper inner quadrant of the left breast in the middle 3rd. 3. The 2 areas of malignancy in the upper inner left breast span 2.8 cm on the MR images with the clips located 3 cm apart on the post clip placement mammogram images. 4. No evidence of malignancy on the right. 5. No adenopathy.   10/2019 -  Anti-estrogen oral therapy   Anastrozole 167monce daily started in 10/2019. Started before her breast surgery.    11/16/2019 Genetic Testing   Negative genetic testing on the common hereditary cancer panel.  The  Common Hereditary Gene Panel offered by [facility] includes sequencing and/or deletion duplication testing of the following 48 genes: APC, ATM, AXIN2, BARD1, BMPR1A, BRCA1, BRCA2, BRIP1,  CDH1, CDK4, CDKN2A (p14ARF), CDKN2A (p16INK4a), CHEK2, CTNNA1, DICER1, EPCAM (Deletion/duplication testing only), GREM1 (promoter region deletion/duplication testing only), KIT, MEN1, MLH1, MSH2, MSH3, MSH6, MUTYH, NBN, NF1, NHTL1, PALB2, PDGFRA, PMS2, POLD1, POLE, PTEN, RAD50, RAD51C, RAD51D, RNF43, SDHB, SDHC, SDHD, SMAD4, SMARCA4. STK11, TP53, TSC1, TSC2, and VHL.  The following genes were evaluated for sequence changes only: SDHA and HOXB13 c.251G>A variant only.  The report date is November 16, 2019.   11/19/2019 Surgery   LEFT BREAST LUMPECTOMY WITH RADIOACTIVE SEED AND SENTINEL LYMPH NODE BIOPSY by Dr Marlou Starks    11/19/2019 Pathology Results   FINAL MICROSCOPIC DIAGNOSIS:   A. LYMPH NODE, LEFT AXILLARY #1, SENTINEL, EXCISION:  - One lymph node with no metastatic carcinoma (0/1).   B. LYMPH NODE, LEFT AXILLARY #2, SENTINEL, EXCISION:  - One lymph node with no metastatic carcinoma (0/1).   C. LYMPH NODE, LEFT AXILLARY #3, SENTINEL, EXCISION:  - One lymph node with no metastatic carcinoma (0/1).   D. BREAST, LEFT, LUMPECTOMY:  - Invasive and in situ lobular carcinoma, 2 tumors, 1.6 and 1.2 cm.  - Invasive lobular carcinoma 0.2 cm from the posterior and superior  margins.  - Lobular carcinoma in situ 0.1 cm from superior margin.  - Biopsy sites and biopsy clips.  - See oncology table and comment.   E. BREAST, LEFT ADDITIONAL MEDIAL INFERIOR MARGIN, EXCISION:  - Focal lobular carcinoma in situ.  - No invasive carcinoma.  - Lobular carcinoma in situ focally less than 0.1 cm from final medial  margin.    11/19/2019 Oncotype testing    Oncotype  Recurrence Score 20  Distant Recurrence risk at 9 years of 6% There is less than 1% benefit of chemotherapy.    11/19/2019 Cancer Staging   Staging form: Breast, AJCC 8th Edition - Pathologic stage from 11/19/2019: Stage IA (pT1c, pN0, cM0, G1, ER+, PR+, HER2-, Oncotype DX score: 20) - Signed by Truitt Merle, MD on 02/11/2020 Stage prefix: Initial  diagnosis Multigene prognostic tests performed: Oncotype DX Recurrence score range: Greater than or equal to 11 Histologic grading system: 3 grade system Residual tumor (R): R0 - None   01/24/2020 - 02/21/2020 Radiation Therapy   Adjuvant Radiation with Dr Sondra Come      CURRENT THERAPY:  Anastrozole 63m once daily started in 10/2019  Adjuvant Radiation with Dr KSondra Come1/10/22-02/21/20  INTERVAL HISTORY:  VDelight Bickleis here for a follow up. She presents to the clinic alone. She notes she is tolerating Radiation. She denies skin burning so far. She denies fatigue. She notes having skin mole on chest and wonders should she dermatologist. She notes she has been tolerating anastrozole well since starting before her surgery. She denies major side effects at this time. She notes she is not satisfied with her weight and is interested in weight loss.     REVIEW OF SYSTEMS:   Constitutional: Denies fevers, chills or abnormal weight loss Eyes: Denies blurriness of vision Ears, nose, mouth, throat, and face: Denies mucositis or sore throat Respiratory: Denies cough, dyspnea or wheezes Cardiovascular: Denies palpitation, chest discomfort or lower extremity swelling Gastrointestinal:  Denies nausea, heartburn or change in bowel habits Skin: Denies abnormal skin rashes Lymphatics: Denies new lymphadenopathy or easy bruising Neurological:Denies numbness, tingling or new weaknesses Behavioral/Psych: Mood is stable, no new changes  All other systems were reviewed with the  patient and are negative.  MEDICAL HISTORY:  Past Medical History:  Diagnosis Date  . Family history of breast cancer   . Family history of colon cancer   . Family history of prostate cancer   . Hypertension   . PONV (postoperative nausea and vomiting)   . Skin cancer     SURGICAL HISTORY: Past Surgical History:  Procedure Laterality Date  . BREAST LUMPECTOMY WITH RADIOACTIVE SEED AND SENTINEL LYMPH NODE BIOPSY Left 11/19/2019    Procedure: LEFT BREAST LUMPECTOMY WITH RADIOACTIVE SEED AND SENTINEL LYMPH NODE BIOPSY;  Surgeon: Jovita Kussmaul, MD;  Location: [place];  Service: General;  Laterality: Left;  . EYE SURGERY    . ROTATOR CUFF REPAIR Right     I have reviewed the social history and family history with the patient and they are unchanged from previous note.  ALLERGIES:  has No Known Allergies.  MEDICATIONS:  Current Outpatient Medications  Medication Sig Dispense Refill  . amLODipine (NORVASC) 2.5 MG tablet Take 2.5 mg by mouth daily.    Marland Kitchen anastrozole (ARIMIDEX) 1 MG tablet Take 1 tablet (1 mg total) by mouth daily. 30 tablet 6  . ezetimibe (ZETIA) 10 MG tablet Take 10 mg by mouth daily.     No current facility-administered medications for this visit.    PHYSICAL EXAMINATION: ECOG PERFORMANCE STATUS: 0 - Asymptomatic  Vitals:   02/11/20 1515  BP: (!) 126/53  Pulse: 88  Resp: 18  Temp: (!) 97.1 F (36.2 C)  SpO2: 98%   Filed Weights   02/11/20 1515  Weight: 171 lb (77.6 kg)    Due to COVID19 we will limit examination to appearance. Patient had no complaints.  GENERAL:alert, no distress and comfortable SKIN: skin color normal, no rashes or significant lesions EYES: normal, Conjunctiva are pink and non-injected, sclera clear  NEURO: alert & oriented x 3 with fluent speech   LABORATORY DATA:  I have reviewed the data as listed No flowsheet data found.   No flowsheet data found.    RADIOGRAPHIC STUDIES: I have personally reviewed the radiological images as listed and agreed with the findings in the report. No results found.   ASSESSMENT & PLAN:  Violett Hobbs is a 65 y.o. female with   1.   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  -She underwent left lumpectomy with SLNB by Dr Marlou Starks on  11/19/19. Surgical path showed 1.6 and 1.2cm masses of invasive lobular carcinoma and LCIS was completely removed with clear margins, LN negative. I reviewed with patient in detail today.  -Her Oncotype showed RS 20 with less than 1% benefit of chemotherapy. I did not recommend chemo. I reviewed with her today.  -To reduce her risk of local recurrence, she proceeded with adjuvant radiation with Dr Sondra Come on 01/24/20. She plans to complete on 02/21/20. She is tolerating well. -Given the strong ER/PR positive disease, she started on Anastrozole in 10/2019. While she waited for surgery. She is tolerating well with no major side effects. Will continue for 10 years given lobular cancer if she tolerates well.  -We also discussed the breast cancer surveillance after her surgery. She will continue annual screening mammogram, self exams, and a routine office visit with lab and exam with [place]. I discussed the option of additional screening with annual breast MRIs. I also discussed Abbreviated  MRIs which have $400 out-of-pocket cost if insurance does not cover this. She will consider this.  -She will proceed with survivorship clinic with NP Lacie in 3 months, Dr Marlou Starks in 6 months and F/u with me in 9 months.    2. Bone Health  -Pt notes last DEXA scan 2-3 years ago with PCP -I discussed AI can weaken her bones, I recommend she obtain new DEXA in 2022. She is agreeable.  -She can continue Vit D. She has stopped calcium due to kidney stones.   3. Genetic testing was negative for pathogenetic mutations   4. HTN, Weight management  -on amlodipine, follow-up with PCP. -She is interested in losing weight. I discussed she can gain weight on AI. I reviewed diet with low carbohydrate and increased exercise.    PLAN:  -Continue RT until 02/21/20  -Continue anastrozole daily  -Mammogram in 08/2020 -she will repeat DEXA at her PCP office this year  -Survivorship clinic with NP Lacie in 3 months  -Lab and f/u with me in  9 months    No problem-specific Assessment & Plan notes found for this encounter.   Orders Placed This Encounter  Procedures  . MM DIAG BREAST TOMO BILATERAL    Standing Status:   Future    Standing Expiration Date:   02/10/2021    Order Specific Question:   Reason for Exam (SYMPTOM  OR DIAGNOSIS REQUIRED)    Answer:   screening    Order Specific Question:   Preferred imaging location?    Answer:   [facility]   All questions were answered. The patient knows to call the clinic with any problems, questions or concerns. No barriers to learning was detected. The total time spent in the appointment was 30 minutes.     Truitt Merle, MD 02/11/2020   I, Sierra Lara, am acting as scribe for Truitt Merle, MD.   I have reviewed the above documentation for accuracy and completeness, and I agree with the above.

## 2020-02-10 ENCOUNTER — Ambulatory Visit: Payer: 59

## 2020-02-10 ENCOUNTER — Ambulatory Visit
Admission: RE | Admit: 2020-02-10 | Discharge: 2020-02-10 | Disposition: A | Discharge status: Home / Self Care | Payer: 59 | Source: Ambulatory Visit | Attending: Radiation Oncology | Admitting: Radiation Oncology

## 2020-02-10 DIAGNOSIS — C50212 Malignant neoplasm of upper-inner quadrant of left female breast: Secondary | ICD-10-CM | POA: Diagnosis not present

## 2020-02-11 ENCOUNTER — Ambulatory Visit: Payer: 59

## 2020-02-11 ENCOUNTER — Encounter: Payer: Self-pay | Admitting: Hematology

## 2020-02-11 ENCOUNTER — Other Ambulatory Visit: Payer: Self-pay

## 2020-02-11 ENCOUNTER — Ambulatory Visit
Admission: RE | Admit: 2020-02-11 | Discharge: 2020-02-11 | Disposition: A | Discharge status: Home / Self Care | Payer: 59 | Source: Ambulatory Visit | Attending: Radiation Oncology | Admitting: Radiation Oncology

## 2020-02-11 ENCOUNTER — Inpatient Hospital Stay: Payer: 59 | Attending: Hematology | Admitting: Hematology

## 2020-02-11 VITALS — BP 126/53 | HR 88 | Temp 97.1°F | Resp 18 | Ht 65.0 in | Wt 171.0 lb

## 2020-02-11 DIAGNOSIS — I10 Essential (primary) hypertension: Secondary | ICD-10-CM | POA: Diagnosis not present

## 2020-02-11 DIAGNOSIS — C50212 Malignant neoplasm of upper-inner quadrant of left female breast: Secondary | ICD-10-CM

## 2020-02-11 DIAGNOSIS — Z170 Estrogen receptor positive status [ER+]: Secondary | ICD-10-CM

## 2020-02-14 ENCOUNTER — Encounter: Payer: Self-pay | Admitting: *Deleted

## 2020-02-14 ENCOUNTER — Ambulatory Visit: Payer: 59

## 2020-02-14 ENCOUNTER — Ambulatory Visit
Admission: RE | Admit: 2020-02-14 | Discharge: 2020-02-14 | Disposition: A | Discharge status: Home / Self Care | Payer: 59 | Source: Ambulatory Visit | Attending: Radiation Oncology | Admitting: Radiation Oncology

## 2020-02-14 ENCOUNTER — Other Ambulatory Visit: Payer: Self-pay

## 2020-02-14 DIAGNOSIS — C50212 Malignant neoplasm of upper-inner quadrant of left female breast: Secondary | ICD-10-CM | POA: Diagnosis not present

## 2020-02-15 ENCOUNTER — Other Ambulatory Visit: Payer: Self-pay

## 2020-02-15 ENCOUNTER — Ambulatory Visit: Payer: 59

## 2020-02-15 ENCOUNTER — Encounter: Payer: Self-pay | Admitting: *Deleted

## 2020-02-15 ENCOUNTER — Ambulatory Visit
Admission: RE | Admit: 2020-02-15 | Discharge: 2020-02-15 | Disposition: A | Discharge status: Home / Self Care | Payer: 59 | Source: Ambulatory Visit | Attending: Radiation Oncology | Admitting: Radiation Oncology

## 2020-02-15 DIAGNOSIS — C50212 Malignant neoplasm of upper-inner quadrant of left female breast: Secondary | ICD-10-CM | POA: Insufficient documentation

## 2020-02-15 DIAGNOSIS — Z170 Estrogen receptor positive status [ER+]: Secondary | ICD-10-CM | POA: Insufficient documentation

## 2020-02-16 ENCOUNTER — Ambulatory Visit: Payer: 59

## 2020-02-16 ENCOUNTER — Ambulatory Visit
Admission: RE | Admit: 2020-02-16 | Discharge: 2020-02-16 | Disposition: A | Discharge status: Home / Self Care | Payer: 59 | Source: Ambulatory Visit | Attending: Radiation Oncology | Admitting: Radiation Oncology

## 2020-02-16 DIAGNOSIS — C50212 Malignant neoplasm of upper-inner quadrant of left female breast: Secondary | ICD-10-CM | POA: Diagnosis not present

## 2020-02-17 ENCOUNTER — Ambulatory Visit: Payer: 59

## 2020-02-17 ENCOUNTER — Encounter: Payer: Self-pay | Admitting: *Deleted

## 2020-02-17 ENCOUNTER — Ambulatory Visit
Admission: RE | Admit: 2020-02-17 | Discharge: 2020-02-17 | Disposition: A | Discharge status: Home / Self Care | Payer: 59 | Source: Ambulatory Visit | Attending: Radiation Oncology | Admitting: Radiation Oncology

## 2020-02-17 ENCOUNTER — Other Ambulatory Visit: Payer: Self-pay | Admitting: *Deleted

## 2020-02-17 DIAGNOSIS — C50212 Malignant neoplasm of upper-inner quadrant of left female breast: Secondary | ICD-10-CM | POA: Diagnosis not present

## 2020-02-18 ENCOUNTER — Ambulatory Visit
Admission: RE | Admit: 2020-02-18 | Discharge: 2020-02-18 | Disposition: A | Discharge status: Home / Self Care | Payer: 59 | Source: Ambulatory Visit | Attending: Radiation Oncology | Admitting: Radiation Oncology

## 2020-02-18 ENCOUNTER — Other Ambulatory Visit: Payer: Self-pay

## 2020-02-18 ENCOUNTER — Ambulatory Visit: Payer: 59

## 2020-02-18 DIAGNOSIS — C50212 Malignant neoplasm of upper-inner quadrant of left female breast: Secondary | ICD-10-CM | POA: Diagnosis not present

## 2020-02-21 ENCOUNTER — Encounter: Payer: Self-pay | Admitting: Radiation Oncology

## 2020-02-21 ENCOUNTER — Ambulatory Visit
Admission: RE | Admit: 2020-02-21 | Discharge: 2020-02-21 | Disposition: A | Discharge status: Home / Self Care | Payer: 59 | Source: Ambulatory Visit | Attending: Radiation Oncology | Admitting: Radiation Oncology

## 2020-02-21 ENCOUNTER — Ambulatory Visit: Payer: 59

## 2020-02-21 DIAGNOSIS — C50212 Malignant neoplasm of upper-inner quadrant of left female breast: Secondary | ICD-10-CM | POA: Diagnosis not present

## 2020-02-22 ENCOUNTER — Ambulatory Visit: Payer: 59

## 2020-02-23 ENCOUNTER — Ambulatory Visit: Payer: 59

## 2020-03-15 ENCOUNTER — Telehealth: Payer: Self-pay | Admitting: Nurse Practitioner

## 2020-03-15 NOTE — Telephone Encounter (Signed)
Left message with rescheduled upcoming appointment due to provider's template. Gave option to call back to reschedule if needed.

## 2020-03-16 ENCOUNTER — Encounter: Payer: Self-pay | Admitting: *Deleted

## 2020-03-20 ENCOUNTER — Ambulatory Visit
Admission: RE | Admit: 2020-03-20 | Discharge: 2020-03-20 | Disposition: A | Discharge status: Home / Self Care | Payer: 59 | Source: Ambulatory Visit | Attending: Radiation Oncology | Admitting: Radiation Oncology

## 2020-03-20 ENCOUNTER — Encounter: Payer: Self-pay | Admitting: Radiation Oncology

## 2020-03-20 ENCOUNTER — Other Ambulatory Visit: Payer: Self-pay

## 2020-03-20 DIAGNOSIS — Z170 Estrogen receptor positive status [ER+]: Secondary | ICD-10-CM | POA: Diagnosis not present

## 2020-03-20 DIAGNOSIS — C50212 Malignant neoplasm of upper-inner quadrant of left female breast: Secondary | ICD-10-CM | POA: Insufficient documentation

## 2020-03-20 DIAGNOSIS — Z79899 Other long term (current) drug therapy: Secondary | ICD-10-CM | POA: Insufficient documentation

## 2020-03-20 DIAGNOSIS — Z79811 Long term (current) use of aromatase inhibitors: Secondary | ICD-10-CM | POA: Insufficient documentation

## 2020-03-20 DIAGNOSIS — Z923 Personal history of irradiation: Secondary | ICD-10-CM | POA: Diagnosis not present

## 2020-03-20 NOTE — Progress Notes (Signed)
Patient is here today for 1 month follow up to radiation to left breast.  Patient denies having any pain.  Denies having any swelling to the left breast, arm or axilla.  Denies having any range of motion limitations.  She states she developing some random muscle pain, ache, decreased libido and plans to talk to Dr Burr Medico about those things then.  Feels it is related to the anastrozole.  Appetite is good.  Denies having any fatigue during radiation.  Reports skin has improved (no longer has itching or redness to that area).  Vitals:   03/20/20 1529  BP: 113/78  Pulse: 83  Resp: 20  Temp: 97.9 F (36.6 C)  SpO2: 97%  Weight: 169 lb 3.2 oz (76.7 kg)  Height: 5\' 5"  (1.651 m)

## 2020-03-20 NOTE — Progress Notes (Signed)
Radiation Oncology         (336) [phone] ________________________________  Name: Sierra Lara MRN: [id number]  Date: 03/20/2020  DOB: 19-Dec-1955  Follow-Up Visit Note  CC: Sierra Broker, MD  Sierra Broker, MD    ICD-10-CM   1. Malignant neoplasm of upper-inner quadrant of left breast in female, estrogen receptor positive [facility])  C50.212    Z17.0     Diagnosis: StageIA(pT1c, pN0, pM0)Multifocal LeftBreast,Invasive Lobular Carcinoma with LCIS, ER+/ PR+/ Her2-, Grade1  Interval Since Last Radiation: One month  Radiation Treatment Dates: 01/24/2020 through 02/21/2020  Site: Left breast Technique: 3D Total Dose (Gy): 40.05/40.05 Dose per Fx (Gy): 2.67 Completed Fx: 15/15 Beam Energies: 6X, 10X  Site: Left breast boost Technique: specialPort Total Dose (Gy): 12/12 Dose per Fx (Gy): 2 Completed Fx: 6/6 Beam Energies: 12E  Narrative:  The patient returns today for routine follow-up. No significant interval history since the end of treatment.  On review of systems, she reports some side effects related to her anastrozole.  She will discuss this with Sierra Lara. She denies itching or discomfort in the breast area nipple discharge or bleeding.  She denies any problems with swelling in her left arm or hand..                       ALLERGIES:  has No Known Allergies.  Meds: Current Outpatient Medications  Medication Sig Dispense Refill   amLODipine (NORVASC) 2.5 MG tablet Take 2.5 mg by mouth daily.     anastrozole (ARIMIDEX) 1 MG tablet Take 1 tablet (1 mg total) by mouth daily. 30 tablet 6   ezetimibe (ZETIA) 10 MG tablet Take 10 mg by mouth daily.     No current facility-administered medications for this encounter.    Physical Findings: The patient is in no acute distress. Patient is alert and oriented.  height is _0  (1.651 m) and weight is 169 lb 3.2 oz (76.7 kg). Her temperature is 97.9 F (36.6 C). Her blood pressure is 113/78 and her pulse is 83. Her  respiration is 20 and oxygen saturation is 97%.  No significant changes. Lungs are clear to auscultation bilaterally. Heart has regular rate and rhythm. No palpable cervical, supraclavicular, or axillary adenopathy. Abdomen soft, non-tender, normal bowel sounds. Right breast: No palpable mass, nipple discharge, or bleeding. Left breast: Skin is well-healed.  Some mild hyperpigmentation changes noted.  No dominant mass appreciated breast nipple discharge or bleeding.  Lab Findings: No results found for: WBC, HGB, HCT, MCV, PLT  Radiographic Findings: No results found.  Impression: StageIA(pT1c, pN0, pM0)Multifocal LeftBreast,Invasive Lobular Carcinoma with LCIS, ER+/ PR+/ Her2-, Grade1  The patient overall tolerated radiation therapy quite well.  She does not really have any lasting side effects at this time.  Plan: The patient is scheduled to follow up with Sierra Rue, NP, on 05/11/2020. She will follow up with radiation oncology in as needed basis in light of her close follow-up with medical oncology.     ____________________________________   Sierra Promise, PhD, MD  This document serves as a record of services personally performed by Sierra Pray, MD. It was created on his behalf by Sierra Lara, a trained medical scribe. The creation of this record is based on the scribe's personal observations and the provider's statements to them. This document has been checked and approved by the attending provider.

## 2020-03-20 NOTE — Progress Notes (Incomplete)
  Patient Name: Sierra Lara MRN: [id number] DOB: 02/17/1955 Referring Physician: Janace Litten (Profile Not Attached) Date of Service: 02/21/2020 [facility], [place]                                                        End Of Treatment Note  Diagnoses: C50.212-Malignant neoplasm of upper-inner quadrant of left female breast  Cancer Staging: StageIA(pT1c, pN0, pM0)Multifocal LeftBreast,Invasive Lobular Carcinoma with LCIS, ER+/ PR+/ Her2-, Grade1  Intent: Curative  Radiation Treatment Dates: 01/24/2020 through 02/21/2020  Site: Left breast Technique: 3D Total Dose (Gy): 40.05/40.05 Dose per Fx (Gy): 2.67 Completed Fx: 15/15 Beam Energies: 6X, 10X  Site: Left breast boost Technique: specialPort Total Dose (Gy): 12/12 Dose per Fx (Gy): 2 Completed Fx: 6/6 Beam Energies: 12E  Narrative: The patient tolerated radiation therapy relatively well. She did report some discomfort in the left axillary region. She denied swelling and issues with range of motion of the left upper extremity. Appetite and energy remained stable. On examination, the skin to the left breast was mildly pink with some raised areas. The upper aspect of the breast had some mild radiation dermatitis.   Plan: The patient will follow-up with radiation oncology in one month.  ________________________________________________   Blair Promise, PhD, MD  This document serves as a record of services personally performed by Gery Pray, MD. It was created on his behalf by Clerance Lav, a trained medical scribe. The creation of this record is based on the scribe's personal observations and the provider's statements to them. This document has been checked and approved by the attending provider.

## 2020-04-13 ENCOUNTER — Telehealth: Payer: Self-pay | Admitting: *Deleted

## 2020-04-13 NOTE — Telephone Encounter (Signed)
Pt called to verify no interaction with topical face treatment Winlevi prescribed by dermatologist. Per pharmacy no interaction. Pt notified.

## 2020-04-26 ENCOUNTER — Other Ambulatory Visit: Payer: Self-pay

## 2020-04-26 MED ORDER — ANASTROZOLE 1 MG PO TABS
1.0000 mg | ORAL_TABLET | Freq: Every day | ORAL | 6 refills | Status: DC
Start: 2020-04-26 — End: 2020-08-10

## 2020-05-10 ENCOUNTER — Encounter: Payer: 59 | Admitting: Nurse Practitioner

## 2020-05-11 ENCOUNTER — Telehealth: Payer: Self-pay | Admitting: Nurse Practitioner

## 2020-05-11 ENCOUNTER — Inpatient Hospital Stay: Payer: 59 | Attending: Hematology | Admitting: Nurse Practitioner

## 2020-05-11 ENCOUNTER — Encounter: Payer: Self-pay | Admitting: Nurse Practitioner

## 2020-05-11 ENCOUNTER — Other Ambulatory Visit: Payer: Self-pay

## 2020-05-11 VITALS — BP 133/65 | HR 85 | Temp 97.8°F | Resp 19 | Ht 65.0 in | Wt 167.8 lb

## 2020-05-11 DIAGNOSIS — Z79899 Other long term (current) drug therapy: Secondary | ICD-10-CM | POA: Insufficient documentation

## 2020-05-11 DIAGNOSIS — Z801 Family history of malignant neoplasm of trachea, bronchus and lung: Secondary | ICD-10-CM | POA: Insufficient documentation

## 2020-05-11 DIAGNOSIS — Z923 Personal history of irradiation: Secondary | ICD-10-CM | POA: Insufficient documentation

## 2020-05-11 DIAGNOSIS — Z803 Family history of malignant neoplasm of breast: Secondary | ICD-10-CM | POA: Diagnosis not present

## 2020-05-11 DIAGNOSIS — Z800 Family history of malignant neoplasm of digestive organs: Secondary | ICD-10-CM | POA: Insufficient documentation

## 2020-05-11 DIAGNOSIS — C50212 Malignant neoplasm of upper-inner quadrant of left female breast: Secondary | ICD-10-CM

## 2020-05-11 DIAGNOSIS — Z170 Estrogen receptor positive status [ER+]: Secondary | ICD-10-CM | POA: Diagnosis not present

## 2020-05-11 DIAGNOSIS — Z79811 Long term (current) use of aromatase inhibitors: Secondary | ICD-10-CM | POA: Insufficient documentation

## 2020-05-11 NOTE — Progress Notes (Signed)
CLINIC:  Survivorship   Patient Care Team: [name], MD as PCP - General (Family Medicine) Truitt Merle, MD as Consulting Physician (Hematology) Jovita Kussmaul, MD as Consulting Physician (General Surgery) Mauro Kaufmann, RN as Oncology Nurse Navigator Rockwell Germany, RN as Oncology Nurse Navigator Gery Pray, MD as Consulting Physician (Radiation Oncology) Alla Feeling, NP as Nurse Practitioner (Nurse Practitioner)  REASON FOR VISIT:  Routine follow-up post-treatment for a recent history of breast cancer.  BRIEF ONCOLOGIC HISTORY:  Oncology History Overview Note  Cancer Staging Malignant neoplasm of upper-inner quadrant of left breast in female, estrogen receptor positive ([place]) Staging form: Breast, AJCC 8th Edition - Clinical stage from 09/22/2019: Stage IA (cT1a, cN0, cM0, G1, ER+, PR+, HER2-) - Signed by Truitt Merle, MD on 10/07/2019    Malignant neoplasm of upper-inner quadrant of left breast in female, estrogen receptor positive ([place])  09/08/2019 Mammogram   Left Diagnostic Mammogram  IMPRESSION 1.Suspicious 85m mass involving the UPPER OUTER QUADRANT of the LEFT breast at the 11:00 position approximately 6cmfn. This is the likely sonographic correlate for the screening mammographic architectural distortion.  2. No pathologic LEFT axillary lymphadnopathy.    09/13/2019 Initial Biopsy   Diagnosis  Breast, left, needle core biopsy, 11:00 position, 6cmfn -INVASIVE LOBULAR CARCINOMA, GRADE 2, AND LOBULAR CARCINOMA IN SITU -TUMOR INVOLVES ALL CORES AND MEASURES 7MM IN MAXIMUM EXTENT IN A SINGLE CORE.  -A BREAST PROGNOSTIC PROFILE WILL BE ORDERED ON BLOCK 1A AND SEPARATELY REPORTED -See comment    09/13/2019 Receptors her2   ER 90% positive, moderately staining intensity PR 80% positive, strong staining intensity  Proliferation Marker Ki67: 2%  HER[facility]ivocal - [facility]  09/22/2019 Cancer Staging   Staging form: Breast, AJCC 8th Edition -  Clinical stage from 09/22/2019: Stage IA (cT1a, cN0, cM0, G1, ER+, PR+, HER2-) - Signed by FTruitt Merle MD on 10/07/2019   09/22/2019 Initial Biopsy   Diagnosis 09/22/19 Breast, left, needle core biopsy, uiq - INVASIVE MAMMARY CARCINOMA, SEE COMMENT. - MAMMARY CARCINOMA IN SITU. Microscopic Comment The carcinoma appears grade 1 and measures 3 mm in greatest linear extent. E-cadherin will be ordered. Prognostic makers will be ordered. Dr. KVic Ripperhas reviewed the case. The case was called to [facility] 09/23/2019.   09/22/2019 Receptors her2   PROGNOSTIC INDICATORS Results: IMMUNOHISTOCHEMICAL AND MORPHOMETRIC ANALYSIS PERFORMED MANUALLY The tumor cells are NEGATIVE for Her2 (1+). Estrogen Receptor: 95%, POSITIVE, STRONG STAINING INTENSITY Progesterone Receptor: 2%, POSITIVE, STRONG STAINING INTENSITY Proliferation Marker Ki67: 15%    10/07/2019 Initial Diagnosis   Malignant neoplasm of upper-inner quadrant of left breast in [place], [place] positive [place]   10/08/2019 Imaging   MRI Breast  IMPRESSION: 1. 1.5 cm biopsy-proven invasive mammary carcinoma and mammary carcinoma in situ in the posterior aspect of the upper inner quadrant of the left breast. 2. 0.8 cm biopsy-proven invasive lobular carcinoma and lobular carcinoma in situ in the upper inner quadrant of the left breast in the middle 3rd. 3. The 2 areas of malignancy in the upper inner left breast span 2.8 cm on the MR images with the clips located 3 cm apart on the post clip placement mammogram images. 4. No evidence of malignancy on the right. 5. No adenopathy.   10/2019 -  Anti-estrogen oral therapy   Anastrozole 180monce daily started in 10/2019. Started before her breast surgery.    11/16/2019 Genetic Testing   Negative genetic testing on the common  hereditary cancer panel.  The Common Hereditary Gene Panel offered by [facility] includes sequencing and/or deletion duplication testing of the  following 48 genes: APC, ATM, AXIN2, BARD1, BMPR1A, BRCA1, BRCA2, BRIP1, CDH1, CDK4, CDKN2A (p14ARF), CDKN2A (p16INK4a), CHEK2, CTNNA1, DICER1, EPCAM (Deletion/duplication testing only), GREM1 (promoter region deletion/duplication testing only), KIT, MEN1, MLH1, MSH2, MSH3, MSH6, MUTYH, NBN, NF1, NHTL1, PALB2, PDGFRA, PMS2, POLD1, POLE, PTEN, RAD50, RAD51C, RAD51D, RNF43, SDHB, SDHC, SDHD, SMAD4, SMARCA4. STK11, TP53, TSC1, TSC2, and VHL.  The following genes were evaluated for sequence changes only: SDHA and HOXB13 c.251G>A variant only.  The report date is November 16, 2019.   11/19/2019 Surgery   LEFT BREAST LUMPECTOMY WITH RADIOACTIVE SEED AND SENTINEL LYMPH NODE BIOPSY by Dr Marlou Starks    11/19/2019 Pathology Results   FINAL MICROSCOPIC DIAGNOSIS:   A. LYMPH NODE, LEFT AXILLARY #1, SENTINEL, EXCISION:  - One lymph node with no metastatic carcinoma (0/1).   B. LYMPH NODE, LEFT AXILLARY #2, SENTINEL, EXCISION:  - One lymph node with no metastatic carcinoma (0/1).   C. LYMPH NODE, LEFT AXILLARY #3, SENTINEL, EXCISION:  - One lymph node with no metastatic carcinoma (0/1).   D. BREAST, LEFT, LUMPECTOMY:  - Invasive and in situ lobular carcinoma, 2 tumors, 1.6 and 1.2 cm.  - Invasive lobular carcinoma 0.2 cm from the posterior and superior  margins.  - Lobular carcinoma in situ 0.1 cm from superior margin.  - Biopsy sites and biopsy clips.  - See oncology table and comment.   E. BREAST, LEFT ADDITIONAL MEDIAL INFERIOR MARGIN, EXCISION:  - Focal lobular carcinoma in situ.  - No invasive carcinoma.  - Lobular carcinoma in situ focally less than 0.1 cm from final medial  margin.    11/19/2019 Oncotype testing    Oncotype  Recurrence Score 20  Distant Recurrence risk at 9 years of 6% There is less than 1% benefit of chemotherapy.    11/19/2019 Cancer Staging   Staging form: Breast, AJCC 8th Edition - Pathologic stage from 11/19/2019: Stage IA (pT1c, pN0, cM0, G1, ER+, PR+, HER2-, Oncotype DX  score: 20) - Signed by Truitt Merle, MD on 02/11/2020 Stage prefix: Initial diagnosis Multigene prognostic tests performed: Oncotype DX Recurrence score range: Greater than or equal to 11 Histologic grading system: 3 grade system Residual tumor (R): R0 - None   01/24/2020 - 02/21/2020 Radiation Therapy   Adjuvant Radiation with Dr Sondra Come   05/11/2020 Survivorship   SCP delivered by Cira Rue, NP      INTERVAL HISTORY:  Ms. Utke presents to the [facility] today for our initial meeting to review her survivorship care plan detailing her treatment course for breast cancer, as well as monitoring long-term side effects of that treatment, education regarding health maintenance, screening, and overall wellness and health promotion.     Overall, Ms. Salais reports feeling well in general except a few issues with anastrozole such as more acne.  Dermatologist prescribed an estrogen cream that did not help so she stopped taking it.  She has calf cramps, but no other significant bone/joint pain.  Her libido is down to nothing but she and her husband have worked through this.  She is willing to tolerate and continue anastrozole.  ROS otherwise reviewed and all negative.  ONCOLOGY TREATMENT TEAM:  1. Surgeon:  Dr. Marlou Starks at [facility] 2. Medical Oncologist: Dr. Burr Medico  3. Radiation Oncologist: Dr. Sondra Come    PAST MEDICAL/SURGICAL HISTORY:  Past Medical History:  Diagnosis Date  . Family history of  breast cancer   . Family history of colon cancer   . Family history of prostate cancer   . History of radiation therapy 01/24/2020-02/21/2020   Left Breast; Dr. Gery Pray  . Hypertension   . PONV (postoperative nausea and vomiting)   . Skin cancer    Past Surgical History:  Procedure Laterality Date  . BREAST LUMPECTOMY WITH RADIOACTIVE SEED AND SENTINEL LYMPH NODE BIOPSY Left 11/19/2019   Procedure: LEFT BREAST LUMPECTOMY WITH RADIOACTIVE SEED AND SENTINEL LYMPH NODE BIOPSY;   Surgeon: Jovita Kussmaul, MD;  Location: [facility];  Service: General;  Laterality: Left;  . EYE SURGERY    . ROTATOR CUFF REPAIR Right      ALLERGIES:  No Known Allergies   CURRENT MEDICATIONS:  Outpatient Encounter Medications as of 05/11/2020  Medication Sig  . amLODipine (NORVASC) 2.5 MG tablet Take 2.5 mg by mouth daily.  Marland Kitchen anastrozole (ARIMIDEX) 1 MG tablet Take 1 tablet (1 mg total) by mouth daily.  Marland Kitchen ezetimibe (ZETIA) 10 MG tablet Take 10 mg by mouth daily.   No facility-administered encounter medications on file as of 05/11/2020.     ONCOLOGIC FAMILY HISTORY:  Family History  Problem Relation Age of Onset  . Prostate cancer Father 7  . Breast cancer Maternal Aunt 60  . Breast cancer Maternal Grandmother        dx in her 77s  . Colon cancer Mother 42  . Lung cancer Maternal Uncle        smoker  . Prostate cancer Brother 73     GENETIC COUNSELING/TESTING: Yes, negative  SOCIAL HISTORY:  Lynnann Knudsen is married and lives with her spouse in [place], [place].  She denies any current or history of tobacco, alcohol, or illicit drug use.     PHYSICAL EXAMINATION:  Vital Signs:   Vitals:   05/11/20 1246  BP: 133/65  Pulse: 85  Resp: 19  Temp: 97.8 F (36.6 C)  SpO2: 99%   Filed Weights   05/11/20 1246  Weight: 167 lb 12.8 oz (76.1 kg)   General: Well-nourished, well-appearing female in no acute distress.   HEENT:  Sclerae anicteric.  Lymph: No cervical, supraclavicular, or infraclavicular lymphadenopathy noted on palpation.  Respiratory:  breathing non-labored.  GI: Abdomen soft and round; non-tender, non-distended. Bowel sounds normoactive.  Neuro: No focal deficits. Steady gait.  Psych: Mood and affect normal and appropriate for situation.  Extremities: No edema. MSK: No focal spinal tenderness to palpation.  Full range of motion in bilateral upper extremities Skin: Warm and dry. Breast exam: Breasts are symmetric without  nipple discharge or inversion.  S/p left lumpectomy, incisions completely healed.  No palpable mass in either breast or axilla that I could appreciate.  LABORATORY DATA:  None for this visit.  DIAGNOSTIC IMAGING:  None for this visit.      ASSESSMENT AND PLAN:  Ms.. Estill is a pleasant 65 y.o. female with Stage 1A left breast invasive lobular carcinoma, ER+/PR+/HER2-, diagnosed in 08/2019, treated with lumpectomy, adjuvant radiation therapy, and anti-estrogen therapy with anastrozole beginning in 10/2019 and resumed in the adjuvant setting.  She presents to the Survivorship Clinic for our initial meeting and routine follow-up post-completion of treatment for breast cancer.    1. Stage 1A left breast cancer:  Ms. Cressy is continuing to recover from definitive treatment for breast cancer. She will follow-up with her medical oncologist, Dr. Burr Medico in 3 months with history and physical exam per surveillance protocol.  She  will continue her anti-estrogen therapy with anastrozole.  Thus far, she is tolerating the anastrozole well, with acne, low libido, and calf cramps.  She is willing to tolerate the side effects. She was instructed to make Dr. Burr Medico or myself aware if she begins to experience any worsening side effects of the medication and I could see her back in clinic to help manage those side effects, as needed. Today, a comprehensive survivorship care plan and treatment summary was reviewed with the patient today detailing her breast cancer diagnosis, treatment course, potential late/long-term effects of treatment, appropriate follow-up care with recommendations for the future, and patient education resources.  Her first posttreatment mammogram is due 08/2020.  Given the lobular histology, she would likely benefit from a screening breast MRI staggered 6 months from mammogram (02/2021), she is agreeable.  A copy of this summary, along with a letter will be sent to the patient's primary care provider via [employer] message after today's visit.    2. Bone health:  Given Ms. Elbert age/history of breast cancer and her current treatment regimen including anti-estrogen therapy with anastrozole, she is at risk for bone demineralization.  Her last DEXA scan was in 2019 per PCP, she plans to repeat in May with her annual wellness visit.  She is off calcium for history of kidney stones, I recommend for her to take daily vitamin D as well as increase her weight-bearing activities.  She was given education on specific activities to promote bone health.  3. Cancer screening:  Due to Ms. Kaufhold's history and her age, she should receive screening for skin cancers, colon cancer, and gynecologic cancers.  The information and recommendations are listed on the patient's comprehensive care plan/treatment summary and were reviewed in detail with the patient.    4. Health maintenance and wellness promotion: Ms. Neer was encouraged to consume 5-7 servings of fruits and vegetables per day. We reviewed the "Nutrition Rainbow" handout, as well as the handout "Take Control of Your Health and Reduce Your Cancer Risk" from the [place].  She was also encouraged to engage in moderate to vigorous exercise for 30 minutes per day most days of the week. We discussed the [employer] fitness program, which is designed for cancer survivors to help them become more physically fit after cancer treatments.  She was instructed to limit her alcohol consumption and continue to abstain from tobacco use.   5. Support services/counseling: It is not uncommon for this period of the patient's cancer care trajectory to be one of many emotions and stressors.  We discussed an opportunity for her to participate in the next session of [facility] ("Finding Your New Normal") support group series designed for patients after they have completed treatment.   Ms. Muriel was encouraged to take advantage of our many other support services programs, support  groups, and/or counseling in coping with her new life as a cancer survivor after completing anti-cancer treatment.  She was offered support today through active listening and expressive supportive counseling.  She was given information regarding our available services and encouraged to contact me with any questions or for help enrolling in any of our support group/programs.    Dispo:   -Continue anastrozole  -Return to cancer center 3 months  -Mammogram due in 08/2020, screening MRI in 02/2021  -Follow up with surgery as indicated  -PCP annual and DEXA in 05/2020  -She is welcome to return back to the Survivorship Clinic at any time; no additional follow-up  needed at this time.  -Consider referral back to survivorship as a long-term survivor for continued surveillance  Orders Placed This Encounter  Procedures  . Ambulatory referral to Obstetrics / Gynecology    Referral Priority:   Routine    Referral Type:   Consultation    Referral Reason:   Specialty Services Required    Requested Specialty:   Obstetrics and Gynecology    Number of Visits Requested:   1    A total of (30) minutes of face-to-face time was spent with this patient with greater than 50% of that time in counseling and care-coordination.   Cira Rue, NP Survivorship Program [facility] [phone]   Note: PRIMARY CARE PROVIDER [name], [place] [phone]

## 2020-05-11 NOTE — Telephone Encounter (Signed)
Scheduled follow-up appointments per 4/28 los. Patient is aware. ?

## 2020-08-10 ENCOUNTER — Other Ambulatory Visit: Payer: Self-pay

## 2020-08-10 ENCOUNTER — Encounter: Payer: Self-pay | Admitting: Hematology

## 2020-08-10 ENCOUNTER — Inpatient Hospital Stay: Payer: 59

## 2020-08-10 ENCOUNTER — Inpatient Hospital Stay: Payer: 59 | Attending: Hematology | Admitting: Hematology

## 2020-08-10 VITALS — BP 136/78 | HR 77 | Temp 98.3°F | Resp 18 | Ht 65.0 in | Wt 173.6 lb

## 2020-08-10 DIAGNOSIS — Z170 Estrogen receptor positive status [ER+]: Secondary | ICD-10-CM | POA: Diagnosis not present

## 2020-08-10 DIAGNOSIS — I10 Essential (primary) hypertension: Secondary | ICD-10-CM | POA: Diagnosis not present

## 2020-08-10 DIAGNOSIS — C50212 Malignant neoplasm of upper-inner quadrant of left female breast: Secondary | ICD-10-CM | POA: Diagnosis present

## 2020-08-10 DIAGNOSIS — Z79811 Long term (current) use of aromatase inhibitors: Secondary | ICD-10-CM | POA: Diagnosis not present

## 2020-08-10 LAB — CBC WITH DIFFERENTIAL (CANCER CENTER ONLY)
Abs Immature Granulocytes: 0.02 10*3/uL (ref 0.00–0.07)
Basophils Absolute: 0.1 10*3/uL (ref 0.0–0.1)
Basophils Relative: 2 %
Eosinophils Absolute: 0.2 10*3/uL (ref 0.0–0.5)
Eosinophils Relative: 3 %
HCT: 39.1 % (ref 36.0–46.0)
Hemoglobin: 13.2 g/dL (ref 12.0–15.0)
Immature Granulocytes: 0 %
Lymphocytes Relative: 25 %
Lymphs Abs: 1.1 10*3/uL (ref 0.7–4.0)
MCH: 31.2 pg (ref 26.0–34.0)
MCHC: 33.8 g/dL (ref 30.0–36.0)
MCV: 92.4 fL (ref 80.0–100.0)
Monocytes Absolute: 0.4 10*3/uL (ref 0.1–1.0)
Monocytes Relative: 10 %
Neutro Abs: 2.7 10*3/uL (ref 1.7–7.7)
Neutrophils Relative %: 60 %
Platelet Count: 209 10*3/uL (ref 150–400)
RBC: 4.23 MIL/uL (ref 3.87–5.11)
RDW: 13.1 % (ref 11.5–15.5)
WBC Count: 4.5 10*3/uL (ref 4.0–10.5)
nRBC: 0 % (ref 0.0–0.2)

## 2020-08-10 LAB — CMP (CANCER CENTER ONLY)
ALT: 41 U/L (ref 0–44)
AST: 32 U/L (ref 15–41)
Albumin: 4 g/dL (ref 3.5–5.0)
Alkaline Phosphatase: 96 U/L (ref 38–126)
Anion gap: 6 (ref 5–15)
BUN: 12 mg/dL (ref 8–23)
CO2: 27 mmol/L (ref 22–32)
Calcium: 9.4 mg/dL (ref 8.9–10.3)
Chloride: 105 mmol/L (ref 98–111)
Creatinine: 0.71 mg/dL (ref 0.44–1.00)
GFR, Estimated: >60 mL/min (ref >60)
Glucose, Bld: 92 mg/dL (ref 70–99)
Potassium: 4.2 mmol/L (ref 3.5–5.1)
Sodium: 138 mmol/L (ref 135–145)
Total Bilirubin: 0.4 mg/dL (ref 0.3–1.2)
Total Protein: 7.1 g/dL (ref 6.5–8.1)

## 2020-08-10 LAB — VITAMIN D 25 HYDROXY (VIT D DEFICIENCY, FRACTURES): Vit D, 25-Hydroxy: 20.37 ng/mL — ABNORMAL LOW (ref 30–100)

## 2020-08-10 MED ORDER — ANASTROZOLE 1 MG PO TABS
1.0000 mg | ORAL_TABLET | Freq: Every day | ORAL | 1 refills | Status: DC
Start: 2020-08-10 — End: 2020-11-14

## 2020-08-10 NOTE — Progress Notes (Signed)
Sierra Lara   [phone] Fax:([phone]) [phone]   Clinic Follow up Note   Patient Care Team: [name], MD as PCP - General (Family Medicine) Truitt Merle, MD as Consulting Physician (Hematology) Jovita Kussmaul, MD as Consulting Physician (General Surgery) Mauro Kaufmann, RN as Oncology Nurse Navigator Rockwell Germany, RN as Oncology Nurse Navigator Gery Pray, MD as Consulting Physician (Radiation Oncology) Alla Feeling, NP as Nurse Practitioner (Nurse Practitioner)  Date of Service:  08/10/2020  CHIEF COMPLAINT: f/u of left breast cancer  SUMMARY OF ONCOLOGIC HISTORY: Oncology History Overview Note  Cancer Staging Malignant neoplasm of upper-inner quadrant of left breast in female, estrogen receptor positive ([place]) Staging form: Breast, AJCC 8th Edition - Clinical stage from 09/22/2019: Stage IA (cT1a, cN0, cM0, G1, ER+, PR+, HER2-) - Signed by Truitt Merle, MD on 10/07/2019    Malignant neoplasm of upper-inner quadrant of left breast in female, estrogen receptor positive ([place])  09/08/2019 Mammogram   Left Diagnostic Mammogram  IMPRESSION 1.Suspicious 66mm mass involving the UPPER OUTER QUADRANT of the LEFT breast at the 11:00 position approximately 6cmfn. This is the likely sonographic correlate for the screening mammographic architectural distortion.  2. No pathologic LEFT axillary lymphadnopathy.    09/13/2019 Initial Biopsy   Diagnosis  Breast, left, needle core biopsy, 11:00 position, 6cmfn -INVASIVE LOBULAR CARCINOMA, GRADE 2, AND LOBULAR CARCINOMA IN SITU -TUMOR INVOLVES ALL CORES AND MEASURES 7MM IN MAXIMUM EXTENT IN A SINGLE CORE.  -A BREAST PROGNOSTIC PROFILE WILL BE ORDERED ON BLOCK 1A AND SEPARATELY REPORTED -See comment    09/13/2019 Receptors her2   ER 90% positive, moderately staining intensity PR 80% positive, strong staining intensity  Proliferation Marker Ki67: 2%  HER2 Equivocal - HER2 Negative on [facility]   09/22/2019 Cancer  Staging   Staging form: Breast, AJCC 8th Edition - Clinical stage from 09/22/2019: Stage IA (cT1a, cN0, cM0, G1, ER+, PR+, HER2-) - Signed by Truitt Merle, MD on 10/07/2019    09/22/2019 Initial Biopsy   Diagnosis 09/22/19 Breast, left, needle core biopsy, uiq - INVASIVE MAMMARY CARCINOMA, SEE COMMENT. - MAMMARY CARCINOMA IN SITU. Microscopic Comment The carcinoma appears grade 1 and measures 3 mm in greatest linear extent. E-cadherin will be ordered. Prognostic makers will be ordered. Dr. Vic Ripper has reviewed the case. The case was called to [facility] on 09/23/2019.   09/22/2019 Receptors her2   PROGNOSTIC INDICATORS Results: IMMUNOHISTOCHEMICAL AND MORPHOMETRIC ANALYSIS PERFORMED MANUALLY The tumor cells are NEGATIVE for Her2 (1+). Estrogen Receptor: 95%, POSITIVE, STRONG STAINING INTENSITY Progesterone Receptor: 2%, POSITIVE, STRONG STAINING INTENSITY Proliferation Marker Ki67: 15%    10/07/2019 Initial Diagnosis   Malignant neoplasm of upper-inner quadrant of left breast in female, estrogen receptor positive ([place])    10/08/2019 Imaging   MRI Breast  IMPRESSION: 1. 1.5 cm biopsy-proven invasive mammary carcinoma and mammary carcinoma in situ in the posterior aspect of the upper inner quadrant of the left breast. 2. 0.8 cm biopsy-proven invasive lobular carcinoma and lobular carcinoma in situ in the upper inner quadrant of the left breast in the middle 3rd. 3. The 2 areas of malignancy in the upper inner left breast span 2.8 cm on the MR images with the clips located 3 cm apart on the post clip placement mammogram images. 4. No evidence of malignancy on the right. 5. No adenopathy.   10/2019 -  Anti-estrogen oral therapy   Anastrozole $RemoveBefo'1mg'rLVgSxAWQKn$  once daily started in 10/2019. Started before her breast surgery.  11/16/2019 Genetic Testing   Negative genetic testing on the common hereditary cancer panel.  The Common Hereditary Gene Panel offered by [facility] includes  sequencing and/or deletion duplication testing of the following 48 genes: APC, ATM, AXIN2, BARD1, BMPR1A, BRCA1, BRCA2, BRIP1, CDH1, CDK4, CDKN2A (p14ARF), CDKN2A (p16INK4a), CHEK2, CTNNA1, DICER1, EPCAM (Deletion/duplication testing only), GREM1 (promoter region deletion/duplication testing only), KIT, MEN1, MLH1, MSH2, MSH3, MSH6, MUTYH, NBN, NF1, NHTL1, PALB2, PDGFRA, PMS2, POLD1, POLE, PTEN, RAD50, RAD51C, RAD51D, RNF43, SDHB, SDHC, SDHD, SMAD4, SMARCA4. STK11, TP53, TSC1, TSC2, and VHL.  The following genes were evaluated for sequence changes only: SDHA and HOXB13 c.251G>A variant only.  The report date is November 16, 2019.   11/19/2019 Surgery   LEFT BREAST LUMPECTOMY WITH RADIOACTIVE SEED AND SENTINEL LYMPH NODE BIOPSY by Dr Marlou Starks    11/19/2019 Pathology Results   FINAL MICROSCOPIC DIAGNOSIS:   A. LYMPH NODE, LEFT AXILLARY #1, SENTINEL, EXCISION:  - One lymph node with no metastatic carcinoma (0/1).   B. LYMPH NODE, LEFT AXILLARY #2, SENTINEL, EXCISION:  - One lymph node with no metastatic carcinoma (0/1).   C. LYMPH NODE, LEFT AXILLARY #3, SENTINEL, EXCISION:  - One lymph node with no metastatic carcinoma (0/1).   D. BREAST, LEFT, LUMPECTOMY:  - Invasive and in situ lobular carcinoma, 2 tumors, 1.6 and 1.2 cm.  - Invasive lobular carcinoma 0.2 cm from the posterior and superior  margins.  - Lobular carcinoma in situ 0.1 cm from superior margin.  - Biopsy sites and biopsy clips.  - See oncology table and comment.   E. BREAST, LEFT ADDITIONAL MEDIAL INFERIOR MARGIN, EXCISION:  - Focal lobular carcinoma in situ.  - No invasive carcinoma.  - Lobular carcinoma in situ focally less than 0.1 cm from final medial  margin.    11/19/2019 Oncotype testing    Oncotype  Recurrence Score 20  Distant Recurrence risk at 9 years of 6% There is less than 1% benefit of chemotherapy.    11/19/2019 Cancer Staging   Staging form: Breast, AJCC 8th Edition - Pathologic stage from 11/19/2019: Stage  IA (pT1c, pN0, cM0, G1, ER+, PR+, HER2-, Oncotype DX score: 20) - Signed by Truitt Merle, MD on 02/11/2020  Stage prefix: Initial diagnosis  Multigene prognostic tests performed: Oncotype DX  Recurrence score range: Greater than or equal to 11  Histologic grading system: 3 grade system  Residual tumor (R): R0 - None    01/24/2020 - 02/21/2020 Radiation Therapy   Adjuvant Radiation with Dr Sondra Come   05/11/2020 Survivorship   SCP delivered by Cira Rue, NP       CURRENT THERAPY:  Anastrozole, 1 mg daily, started 10/2019  INTERVAL HISTORY:  Sierra Lara is here for a follow up of breast cancer. She was last seen by me on 02/11/20 and in the survivorship clinic on 05/11/20. She presents to the clinic alone. She reports she is doing well mentally ("10 out of 10!") but less well physically ("6 out of 10"). She notes her weight has gone up, and she has high cholesterol. She notes she has not really tried to lose weight either. She notes she tried to use her rowing machine, but she developed a pain in her side that "put me out of commission for 3-4 days!" She notes she also tried gardening, but this hurt her knees. She notes she is scheduled for DEXA in the near future. She reports she has been having muscle cramps. She is taking vitamin D, as well as magnesium malate to  prevent/help with cramps. She denies joint pain or hot flashes from anastrozole. Her only complaint is the weight gain.   All other systems were reviewed with the patient and are negative.  MEDICAL HISTORY:  Past Medical History:  Diagnosis Date   Family history of breast cancer    Family history of colon cancer    Family history of prostate cancer    History of radiation therapy 01/24/2020-02/21/2020   Left Breast; Dr. Gery Pray   Hypertension    PONV (postoperative nausea and vomiting)    Skin cancer     SURGICAL HISTORY: Past Surgical History:  Procedure Laterality Date   BREAST LUMPECTOMY WITH RADIOACTIVE SEED  AND SENTINEL LYMPH NODE BIOPSY Left 11/19/2019   Procedure: LEFT BREAST LUMPECTOMY WITH RADIOACTIVE SEED AND SENTINEL LYMPH NODE BIOPSY;  Surgeon: Jovita Kussmaul, MD;  Location: [place];  Service: General;  Laterality: Left;   EYE SURGERY     ROTATOR CUFF REPAIR Right     I have reviewed the social history and family history with the patient and they are unchanged from previous note.  ALLERGIES:  has No Known Allergies.  MEDICATIONS:  Current Outpatient Medications  Medication Sig Dispense Refill   amLODipine (NORVASC) 2.5 MG tablet Take 2.5 mg by mouth daily.     anastrozole (ARIMIDEX) 1 MG tablet Take 1 tablet (1 mg total) by mouth daily. 90 tablet 1   ezetimibe (ZETIA) 10 MG tablet Take 10 mg by mouth daily.     No current facility-administered medications for this visit.    PHYSICAL EXAMINATION: ECOG PERFORMANCE STATUS: 0 - Asymptomatic  Vitals:   08/10/20 1139  BP: 136/78  Pulse: 77  Resp: 18  Temp: 98.3 F (36.8 C)  SpO2: 99%   Filed Weights   08/10/20 1139  Weight: 173 lb 9.6 oz (78.7 kg)    GENERAL:alert, no distress and comfortable SKIN: skin color normal, no rashes or significant lesions EYES: normal, Conjunctiva are pink and non-injected, sclera clear  NEURO: alert & oriented x 3 with fluent speech BREAST: declined  LABORATORY DATA:  I have reviewed the data as listed CBC Latest Ref Rng & Units 08/10/2020  WBC 4.0 - 10.5 K/uL 4.5  Hemoglobin 12.0 - 15.0 g/dL 13.2  Hematocrit 36.0 - 46.0 % 39.1  Platelets 150 - 400 K/uL 209     CMP Latest Ref Rng & Units 08/10/2020  Glucose 70 - 99 mg/dL 92  BUN 8 - 23 mg/dL 12  Creatinine 0.44 - 1.00 mg/dL 0.71  Sodium 135 - 145 mmol/L 138  Potassium 3.5 - 5.1 mmol/L 4.2  Chloride 98 - 111 mmol/L 105  CO2 22 - 32 mmol/L 27  Calcium 8.9 - 10.3 mg/dL 9.4  Total Protein 6.5 - 8.1 g/dL 7.1  Total Bilirubin 0.3 - 1.2 mg/dL 0.4  Alkaline Phos 38 - 126 U/L 96  AST 15 - 41 U/L 32  ALT 0 - 44 U/L 41      RADIOGRAPHIC STUDIES: I have personally reviewed the radiological images as listed and agreed with the findings in the report. No results found.   ASSESSMENT & PLAN:  Sierra Lara is a 65 y.o. female with   1.   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 -She underwent left lumpectomy with SLNB by Dr Marlou Starks on 11/19/19. Surgical path showed  1.6 and 1.2cm masses of invasive lobular carcinoma and LCIS was completely removed with clear margins, LN negative. -Her Oncotype showed RS 20 so adjuvant chemo was not recommended  -She received adjuvant radiation with Dr Sondra Come from 01/24/20 through 02/21/20.  -Given the strong ER/PR positive disease, she started on Anastrozole neoadjuvantly in 10/2019. She is tolerating well with no major side effects. Will continue for 10 years given lobular cancer if she tolerates well. -Next mammogram scheduled for 08/21/20.    2. Bone Health, Health maintenance -Pt notes last DEXA scan 2-3 years ago with PCP -I discussed AI can weaken her bones, I previously recommend she obtain new DEXA in 2022. She notes she is scheduled in the near future. -She can continue Vit D. She has stopped calcium due to kidney stones.  -She notes she needs a new gynecologist, preferably a female. I gave her a few recommendations.   3. Genetic testing was negative for pathogenetic mutations    4. HTN, Weight management  -on amlodipine, follow-up with PCP. -She is interested in losing weight but notes she lacks motivation.     PLAN:  -Continue anastrozole daily -Mammogram 08/21/20 -DEXA with PCP as scheduled -labs and f/u in 6 months    No problem-specific Assessment & Plan notes found for this encounter.   No orders of the defined types were placed in this encounter.  All questions were answered. The patient  knows to call the clinic with any problems, questions or concerns. No barriers to learning was detected. The total time spent in the appointment was 25 minutes.     Truitt Merle, MD 08/10/2020   I, Wilburn Mylar, am acting as scribe for Truitt Merle, MD.   I have reviewed the above documentation for accuracy and completeness, and I agree with the above.

## 2020-08-16 ENCOUNTER — Telehealth: Payer: Self-pay

## 2020-08-16 NOTE — Telephone Encounter (Signed)
-----   Message from Truitt Merle, MD sent at 08/16/2020  6:36 AM EDT ----- Please let pt know her vitD level is low, please call in vitD 50K weekly X8 for her, thanks   Truitt Merle  08/16/2020

## 2020-08-16 NOTE — Telephone Encounter (Signed)
This nurse attempted to reach patient related to lab results and recommendations per Dr. Burr Medico.  Left message for patient to return call to clinic.

## 2020-08-21 ENCOUNTER — Ambulatory Visit
Admission: RE | Admit: 2020-08-21 | Discharge: 2020-08-21 | Disposition: A | Discharge status: Home / Self Care | Payer: 59 | Source: Ambulatory Visit | Attending: Hematology | Admitting: Hematology

## 2020-08-21 ENCOUNTER — Other Ambulatory Visit: Payer: Self-pay

## 2020-08-21 DIAGNOSIS — C50212 Malignant neoplasm of upper-inner quadrant of left female breast: Secondary | ICD-10-CM

## 2020-08-21 DIAGNOSIS — Z170 Estrogen receptor positive status [ER+]: Secondary | ICD-10-CM

## 2020-08-21 HISTORY — DX: Personal history of irradiation: Z92.3

## 2020-10-19 ENCOUNTER — Telehealth: Payer: Self-pay | Admitting: *Deleted

## 2020-10-19 NOTE — Telephone Encounter (Signed)
Pt called with c/o joint stiffness and pain since May from anastrozol. Informed pt to stop anastrozole x2 wks to see if symptoms improve. Received verbal understanding. Pt will reassess AI with Lacie at f/u appt on 11/09/20. Informed pt of the other AI that would be recommended.

## 2020-10-24 ENCOUNTER — Telehealth: Payer: Self-pay | Admitting: Nurse Practitioner

## 2020-10-24 NOTE — Telephone Encounter (Signed)
Rescheduled per provider template change, pt has been called and confirmed appt

## 2020-11-08 ENCOUNTER — Other Ambulatory Visit: Payer: 59

## 2020-11-08 ENCOUNTER — Ambulatory Visit: Payer: 59 | Admitting: Nurse Practitioner

## 2020-11-09 ENCOUNTER — Ambulatory Visit: Payer: 59 | Admitting: Nurse Practitioner

## 2020-11-09 ENCOUNTER — Other Ambulatory Visit: Payer: 59

## 2020-11-10 ENCOUNTER — Other Ambulatory Visit: Payer: 59

## 2020-11-10 ENCOUNTER — Ambulatory Visit: Payer: 59 | Admitting: Hematology

## 2020-11-13 ENCOUNTER — Other Ambulatory Visit: Payer: Self-pay | Admitting: Nurse Practitioner

## 2020-11-13 DIAGNOSIS — Z170 Estrogen receptor positive status [ER+]: Secondary | ICD-10-CM

## 2020-11-13 DIAGNOSIS — C50212 Malignant neoplasm of upper-inner quadrant of left female breast: Secondary | ICD-10-CM

## 2020-11-13 NOTE — Progress Notes (Signed)
[facility]   [phone] [phone]   Clinic Follow up Note   Patient Care Team: [name], MD as PCP - General (Family Medicine) Truitt Merle, MD as Consulting Physician (Hematology) Jovita Kussmaul, MD as Consulting Physician (General Surgery) Mauro Kaufmann, RN as Oncology Nurse Navigator Rockwell Germany, RN as Oncology Nurse Navigator Gery Pray, MD as Consulting Physician (Radiation Oncology) Alla Feeling, NP as Nurse Practitioner (Nurse Practitioner) 11/14/2020  CHIEF COMPLAINT: Follow up left breast cancer   SUMMARY OF ONCOLOGIC HISTORY: Oncology History Overview Note  Cancer Staging Malignant neoplasm of upper-inner quadrant of left breast in female, estrogen receptor positive ([place]) Staging form: Breast, AJCC 8th Edition - Clinical stage from 09/22/2019: Stage IA (cT1a, cN0, cM0, G1, ER+, PR+, HER2-) - Signed by Truitt Merle, MD on 10/07/2019    Malignant neoplasm of upper-inner quadrant of left breast in female, estrogen receptor positive ([place])  09/08/2019 Mammogram   Left Diagnostic Mammogram  IMPRESSION 1.Suspicious 81mm mass involving the UPPER OUTER QUADRANT of the LEFT breast at the 11:00 position approximately 6cmfn. This is the likely sonographic correlate for the screening mammographic architectural distortion.  2. No pathologic LEFT axillary lymphadnopathy.    09/13/2019 Initial Biopsy   Diagnosis  Breast, left, needle core biopsy, 11:00 position, 6cmfn -INVASIVE LOBULAR CARCINOMA, GRADE 2, AND LOBULAR CARCINOMA IN SITU -TUMOR INVOLVES ALL CORES AND MEASURES 7MM IN MAXIMUM EXTENT IN A SINGLE CORE.  -A BREAST PROGNOSTIC PROFILE WILL BE ORDERED ON BLOCK 1A AND SEPARATELY REPORTED -See comment    09/13/2019 Receptors her2   ER 90% positive, moderately staining intensity PR 80% positive, strong staining intensity  Proliferation Marker Ki67: 2%  HER2 Equivocal - HER2 Negative on [facility]   09/22/2019 Cancer Staging    Staging form: Breast, AJCC 8th Edition - Clinical stage from 09/22/2019: Stage IA (cT1a, cN0, cM0, G1, ER+, PR+, HER2-) - Signed by Truitt Merle, MD on 10/07/2019    09/22/2019 Initial Biopsy   Diagnosis 09/22/19 Breast, left, needle core biopsy, uiq - INVASIVE MAMMARY CARCINOMA, SEE COMMENT. - MAMMARY CARCINOMA IN SITU. Microscopic Comment The carcinoma appears grade 1 and measures 3 mm in greatest linear extent. E-cadherin will be ordered. Prognostic makers will be ordered. Dr. Vic Ripper has reviewed the case. The case was called to [facility] on 09/23/2019.   09/22/2019 Receptors her2   PROGNOSTIC INDICATORS Results: IMMUNOHISTOCHEMICAL AND MORPHOMETRIC ANALYSIS PERFORMED MANUALLY The tumor cells are NEGATIVE for Her2 (1+). Estrogen Receptor: 95%, POSITIVE, STRONG STAINING INTENSITY Progesterone Receptor: 2%, POSITIVE, STRONG STAINING INTENSITY Proliferation Marker Ki67: 15%    10/07/2019 Initial Diagnosis   Malignant neoplasm of upper-inner quadrant of left breast in female, estrogen receptor positive ([place])   10/08/2019 Imaging   MRI Breast  IMPRESSION: 1. 1.5 cm biopsy-proven invasive mammary carcinoma and mammary carcinoma in situ in the posterior aspect of the upper inner quadrant of the left breast. 2. 0.8 cm biopsy-proven invasive lobular carcinoma and lobular carcinoma in situ in the upper inner quadrant of the left breast in the middle 3rd. 3. The 2 areas of malignancy in the upper inner left breast span 2.8 cm on the MR images with the clips located 3 cm apart on the post clip placement mammogram images. 4. No evidence of malignancy on the right. 5. No adenopathy.   10/2019 -  Anti-estrogen oral therapy   Anastrozole $RemoveBefo'1mg'riqYUTvNzRd$  once daily started in 10/2019. Started before her breast surgery.    11/16/2019 Genetic Testing  Negative genetic testing on the common hereditary cancer panel.  The Common Hereditary Gene Panel offered by [facility] includes sequencing  and/or deletion duplication testing of the following 48 genes: APC, ATM, AXIN2, BARD1, BMPR1A, BRCA1, BRCA2, BRIP1, CDH1, CDK4, CDKN2A (p14ARF), CDKN2A (p16INK4a), CHEK2, CTNNA1, DICER1, EPCAM (Deletion/duplication testing only), GREM1 (promoter region deletion/duplication testing only), KIT, MEN1, MLH1, MSH2, MSH3, MSH6, MUTYH, NBN, NF1, NHTL1, PALB2, PDGFRA, PMS2, POLD1, POLE, PTEN, RAD50, RAD51C, RAD51D, RNF43, SDHB, SDHC, SDHD, SMAD4, SMARCA4. STK11, TP53, TSC1, TSC2, and VHL.  The following genes were evaluated for sequence changes only: SDHA and HOXB13 c.251G>A variant only.  The report date is November 16, 2019.   11/19/2019 Surgery   LEFT BREAST LUMPECTOMY WITH RADIOACTIVE SEED AND SENTINEL LYMPH NODE BIOPSY by Dr Marlou Starks    11/19/2019 Pathology Results   FINAL MICROSCOPIC DIAGNOSIS:   A. LYMPH NODE, LEFT AXILLARY #1, SENTINEL, EXCISION:  - One lymph node with no metastatic carcinoma (0/1).   B. LYMPH NODE, LEFT AXILLARY #2, SENTINEL, EXCISION:  - One lymph node with no metastatic carcinoma (0/1).   C. LYMPH NODE, LEFT AXILLARY #3, SENTINEL, EXCISION:  - One lymph node with no metastatic carcinoma (0/1).   D. BREAST, LEFT, LUMPECTOMY:  - Invasive and in situ lobular carcinoma, 2 tumors, 1.6 and 1.2 cm.  - Invasive lobular carcinoma 0.2 cm from the posterior and superior  margins.  - Lobular carcinoma in situ 0.1 cm from superior margin.  - Biopsy sites and biopsy clips.  - See oncology table and comment.   E. BREAST, LEFT ADDITIONAL MEDIAL INFERIOR MARGIN, EXCISION:  - Focal lobular carcinoma in situ.  - No invasive carcinoma.  - Lobular carcinoma in situ focally less than 0.1 cm from final medial  margin.    11/19/2019 Oncotype testing    Oncotype  Recurrence Score 20  Distant Recurrence risk at 9 years of 6% There is less than 1% benefit of chemotherapy.    11/19/2019 Cancer Staging   Staging form: Breast, AJCC 8th Edition - Pathologic stage from 11/19/2019: Stage IA (pT1c,  pN0, cM0, G1, ER+, PR+, HER2-, Oncotype DX score: 20) - Signed by Truitt Merle, MD on 02/11/2020 Stage prefix: Initial diagnosis Multigene prognostic tests performed: Oncotype DX Recurrence score range: Greater than or equal to 11 Histologic grading system: 3 grade system Residual tumor (R): R0 - None    01/24/2020 - 02/21/2020 Radiation Therapy   Adjuvant Radiation with Dr Sondra Come   05/11/2020 Survivorship   SCP delivered by Cira Rue, NP      CURRENT THERAPY: Anastrozole 1 mg daily, starting 10/2019  INTERVAL HISTORY: Ms. Jaffee returns for follow up as scheduled last seen by Dr. Burr Medico 08/10/20.  She is doing well overall.  She had been tolerating anastrozole well for the past year.  She noted some low libido.  Within the last 6 months she developed dry eyes.  In May she was planting in the yard and developed left knee pain that then occurred on the right knee, and shoulders.  She developed foot and leg cramps that eventually resolved with the supplement.  At her physical she was told her cholesterol had increased and was started on a statin.  By July she had worsening diffuse joint pain.  She spoke to [place] to report diffuse joint pain and stopped anastrozole as instructed on 10/20/2020.  Back pain has improved but not resolved.  He is questioning what antiestrogen therapy is doing to her joints and her general health.  She is  otherwise doing well, denies concerns in her breast such as new lump/mass, nipple discharge.  The left breast does appear slightly pink.  Denies changes in her bowel habits, GI/GYN bleeding, recent infection, or any other specific concerns.  MEDICAL HISTORY:  Past Medical History:  Diagnosis Date   Family history of breast cancer    Family history of colon cancer    Family history of prostate cancer    History of radiation therapy 01/24/2020-02/21/2020   Left Breast; Dr. Gery Pray   Hypertension    Personal history of radiation therapy    PONV (postoperative  nausea and vomiting)    Skin cancer     SURGICAL HISTORY: Past Surgical History:  Procedure Laterality Date   BREAST BIOPSY Left 09/22/2019   BREAST LUMPECTOMY Left 11/19/2019   BREAST LUMPECTOMY WITH RADIOACTIVE SEED AND SENTINEL LYMPH NODE BIOPSY Left 11/19/2019   Procedure: LEFT BREAST LUMPECTOMY WITH RADIOACTIVE SEED AND SENTINEL LYMPH NODE BIOPSY;  Surgeon: Jovita Kussmaul, MD;  Location: [facility];  Service: General;  Laterality: Left;   EYE SURGERY     ROTATOR CUFF REPAIR Right     I have reviewed the social history and family history with the patient and they are unchanged from previous note.  ALLERGIES:  has No Known Allergies.  MEDICATIONS:  Current Outpatient Medications  Medication Sig Dispense Refill   amLODipine (NORVASC) 2.5 MG tablet Take 2.5 mg by mouth daily.     Cholecalciferol (VITAMIN D3 PO) Take 25 mcg by mouth daily.     exemestane (AROMASIN) 25 MG tablet Take 1 tablet (25 mg total) by mouth daily after breakfast. 90 tablet 1   ezetimibe (ZETIA) 10 MG tablet Take 10 mg by mouth daily.     fluvastatin (LESCOL) 20 MG capsule Take 1 capsule by mouth every evening. (Patient not taking: Reported on 11/14/2020)     MAGNESIUM MALATE PO Take 2 capsules by mouth daily. (Patient not taking: Reported on 11/14/2020)     No current facility-administered medications for this visit.    PHYSICAL EXAMINATION: ECOG PERFORMANCE STATUS: 1 - Symptomatic but completely ambulatory  Vitals:   11/14/20 1021  BP: (!) 143/82  Pulse: 77  Resp: 17  Temp: 98.7 F (37.1 C)  SpO2: 97%   Filed Weights   11/14/20 1021  Weight: 170 lb 6.4 oz (77.3 kg)    GENERAL:alert, no distress and comfortable SKIN: No rash EYES: sclera clear LYMPH:  no palpable cervical or supraclavicular lymphadenopathy LUNGS: normal breathing effort HEART: no lower extremity edema ABDOMEN:abdomen soft, non-tender and normal bowel sounds Musculoskeletal: No focal tenderness NEURO: alert  & oriented x 3 with fluent speech, no focal motor/sensory deficits Breast exam: Breasts are symmetric without nipple discharge or inversion.  S/p left lumpectomy, incisions completely healed.  Mild erythema without warmth or rash in the central breast, no dimpling, edema, or rash.  No palpable mass or nodularity in either breast or axilla that I could appreciate.  LABORATORY DATA:  I have reviewed the data as listed CBC Latest Ref Rng & Units 11/14/2020 08/10/2020  WBC 4.0 - 10.5 K/uL 4.7 4.5  Hemoglobin 12.0 - 15.0 g/dL 13.4 13.2  Hematocrit 36.0 - 46.0 % 40.5 39.1  Platelets 150 - 400 K/uL 197 209     CMP Latest Ref Rng & Units 11/14/2020 08/10/2020  Glucose 70 - 99 mg/dL 113(H) 92  BUN 8 - 23 mg/dL 13 12  Creatinine 0.44 - 1.00 mg/dL 0.72 0.71  Sodium 135 -  145 mmol/L 140 138  Potassium 3.5 - 5.1 mmol/L 4.1 4.2  Chloride 98 - 111 mmol/L 106 105  CO2 22 - 32 mmol/L 26 27  Calcium 8.9 - 10.3 mg/dL 9.2 9.4  Total Protein 6.5 - 8.1 g/dL 6.9 7.1  Total Bilirubin 0.3 - 1.2 mg/dL 0.5 0.4  Alkaline Phos 38 - 126 U/L 96 96  AST 15 - 41 U/L 26 32  ALT 0 - 44 U/L 46(H) 41      RADIOGRAPHIC STUDIES: I have personally reviewed the radiological images as listed and agreed with the findings in the report. No results found.   ASSESSMENT & PLAN: Connee Ikner is a 65 y.o. female with    1.   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  S/p left lumpectomy with SLNB by Dr Marlou Starks on 11/19/19.  -Her Oncotype showed RS 20 low risk, with 6% recurrence risk of distant metastasis at 9 years of antiestrogen therapy alone.  Adjuvant chemo was not recommended.  She completed adjuvant radiation with Dr Sondra Come from 01/24/20 through 02/21/20.  -Given the strong ER/PR positive disease, she started on Anastrozole neoadjuvantly in 10/2019, goal is 10 years  given the lobular histology if she tolerates well. -Mammogram 08/21/2020 was negative   2. Bone Health, Health maintenance -She has stopped calcium due to kidney stones.  Vitamin D level of 20 on 08/10/2020, continue 25 mcg, with 50 mcg on Monday Wednesday Friday -DEXA 08/18/2020 shows osteopenia, lowest T score -2.0 at the right femur neck.  Overall DEXA has worsened slightly since 2019.   -We reviewed that AI's can contribute to low bone density -I recommend IV bisphosphonate with Zometa every 6 months x2 years.  We reviewed potential risk and benefit.  She agrees to proceed.   3. Genetic testing was negative for pathogenetic mutations    4. HTN, Weight management, HL -Per PCP -Reviewed statins can contribute to bone/joint pain and muscle cramps.  She has been off fluvastatin, plans to resume  Disposition: Ms. Sheu is clinically doing well.  Breast exam is benign, labs are unremarkable.  Mammogram 08/21/2020 is negative.  Overall there is no clinical concern for recurrence.  Continue surveillance.  She has developed intolerance to anastrozole after 1 year, with dry eyes, low libido, and diffuse joint pain in the knees and ankles.  She has been off anastrozole since 10/20/2020, pain has improved but not resolved.  We discussed various etiologies for joint pain including statins, AI's, and possible arthritic pain.  I reviewed her surgical pathology and Oncotype, the lobular histology, and the ER/PR positive disease and recommend to try to continue antiestrogen if possible.  He does not want to try tamoxifen.  She agrees to try exemestane, she will start in the next 4-6 weeks.  For osteopenia, I recommend Zometa, potential risk and benefit was reviewed.  She agrees to proceed.  We will start in the next few weeks.  Follow-up with phone call for med check in 3 months, then lab, in person visit, and Zometa in 6 months.   All questions were answered. The patient knows to call the clinic with any  problems, questions or concerns. No barriers to learning were detected.  Total encounter time is 30 minutes.     Alla Feeling, NP 11/14/20

## 2020-11-14 ENCOUNTER — Encounter: Payer: Self-pay | Admitting: Nurse Practitioner

## 2020-11-14 ENCOUNTER — Other Ambulatory Visit: Payer: Self-pay

## 2020-11-14 ENCOUNTER — Inpatient Hospital Stay (HOSPITAL_BASED_OUTPATIENT_CLINIC_OR_DEPARTMENT_OTHER): Payer: 59 | Admitting: Nurse Practitioner

## 2020-11-14 ENCOUNTER — Inpatient Hospital Stay: Payer: 59 | Attending: Nurse Practitioner

## 2020-11-14 VITALS — BP 143/82 | HR 77 | Temp 98.7°F | Resp 17 | Wt 170.4 lb

## 2020-11-14 DIAGNOSIS — C50212 Malignant neoplasm of upper-inner quadrant of left female breast: Secondary | ICD-10-CM | POA: Diagnosis not present

## 2020-11-14 DIAGNOSIS — Z170 Estrogen receptor positive status [ER+]: Secondary | ICD-10-CM | POA: Diagnosis not present

## 2020-11-14 DIAGNOSIS — I10 Essential (primary) hypertension: Secondary | ICD-10-CM | POA: Diagnosis not present

## 2020-11-14 DIAGNOSIS — Z79811 Long term (current) use of aromatase inhibitors: Secondary | ICD-10-CM | POA: Insufficient documentation

## 2020-11-14 DIAGNOSIS — M8580 Other specified disorders of bone density and structure, unspecified site: Secondary | ICD-10-CM | POA: Insufficient documentation

## 2020-11-14 LAB — CBC WITH DIFFERENTIAL (CANCER CENTER ONLY)
Abs Immature Granulocytes: 0.02 10*3/uL (ref 0.00–0.07)
Basophils Absolute: 0.1 10*3/uL (ref 0.0–0.1)
Basophils Relative: 1 %
Eosinophils Absolute: 0.2 10*3/uL (ref 0.0–0.5)
Eosinophils Relative: 4 %
HCT: 40.5 % (ref 36.0–46.0)
Hemoglobin: 13.4 g/dL (ref 12.0–15.0)
Immature Granulocytes: 0 %
Lymphocytes Relative: 26 %
Lymphs Abs: 1.2 10*3/uL (ref 0.7–4.0)
MCH: 30.7 pg (ref 26.0–34.0)
MCHC: 33.1 g/dL (ref 30.0–36.0)
MCV: 92.9 fL (ref 80.0–100.0)
Monocytes Absolute: 0.5 10*3/uL (ref 0.1–1.0)
Monocytes Relative: 11 %
Neutro Abs: 2.8 10*3/uL (ref 1.7–7.7)
Neutrophils Relative %: 58 %
Platelet Count: 197 10*3/uL (ref 150–400)
RBC: 4.36 MIL/uL (ref 3.87–5.11)
RDW: 13.1 % (ref 11.5–15.5)
WBC Count: 4.7 10*3/uL (ref 4.0–10.5)
nRBC: 0 % (ref 0.0–0.2)

## 2020-11-14 LAB — CMP (CANCER CENTER ONLY)
ALT: 46 U/L — ABNORMAL HIGH (ref 0–44)
AST: 26 U/L (ref 15–41)
Albumin: 3.9 g/dL (ref 3.5–5.0)
Alkaline Phosphatase: 96 U/L (ref 38–126)
Anion gap: 8 (ref 5–15)
BUN: 13 mg/dL (ref 8–23)
CO2: 26 mmol/L (ref 22–32)
Calcium: 9.2 mg/dL (ref 8.9–10.3)
Chloride: 106 mmol/L (ref 98–111)
Creatinine: 0.72 mg/dL (ref 0.44–1.00)
GFR, Estimated: >60 mL/min (ref >60)
Glucose, Bld: 113 mg/dL — ABNORMAL HIGH (ref 70–99)
Potassium: 4.1 mmol/L (ref 3.5–5.1)
Sodium: 140 mmol/L (ref 135–145)
Total Bilirubin: 0.5 mg/dL (ref 0.3–1.2)
Total Protein: 6.9 g/dL (ref 6.5–8.1)

## 2020-11-14 MED ORDER — EXEMESTANE 25 MG PO TABS: 25.0000 mg | ORAL_TABLET | Freq: Every day | ORAL | 1 refills | Status: DC

## 2020-11-15 ENCOUNTER — Telehealth: Payer: Self-pay | Admitting: Nurse Practitioner

## 2020-11-15 NOTE — Telephone Encounter (Signed)
Scheduled follow-up appointments per 11/1 los. Patient is aware. 

## 2020-11-27 ENCOUNTER — Inpatient Hospital Stay: Payer: 59

## 2020-11-27 ENCOUNTER — Other Ambulatory Visit: Payer: Self-pay

## 2020-11-27 VITALS — BP 126/77 | HR 66 | Temp 98.4°F | Resp 14 | Wt 170.0 lb

## 2020-11-27 DIAGNOSIS — C50212 Malignant neoplasm of upper-inner quadrant of left female breast: Secondary | ICD-10-CM | POA: Diagnosis not present

## 2020-11-27 DIAGNOSIS — M85851 Other specified disorders of bone density and structure, right thigh: Secondary | ICD-10-CM

## 2020-11-27 MED ORDER — ZOLEDRONIC ACID 4 MG/100ML IV SOLN
4.0000 mg | Freq: Once | INTRAVENOUS | Status: AC
  Administered 2020-11-27: 4 mg via INTRAVENOUS
  Filled 2020-11-27: qty 100

## 2020-11-27 MED ORDER — SODIUM CHLORIDE 0.9 % IV SOLN
Freq: Once | INTRAVENOUS | Status: AC
Start: 2020-11-27 — End: 2020-11-27

## 2020-11-27 NOTE — Patient Instructions (Signed)
[facility] MEDICAL ONCOLOGY  Discharge Instructions: Thank you for choosing [facility] to provide your oncology and hematology care.   If you have a lab appointment with the Cancer Center, please go directly to the Cancer Center and check in at the registration area.   Wear comfortable clothing and clothing appropriate for easy access to any Portacath or PICC line.   We strive to give you quality time with your provider. You may need to reschedule your appointment if you arrive late (15 or more minutes).  Arriving late affects you and other patients whose appointments are after yours.  Also, if you miss three or more appointments without notifying the office, you may be dismissed from the clinic at the provider's discretion.      For prescription refill requests, have your pharmacy contact our office and allow 72 hours for refills to be completed.    Today you received the following chemotherapy and/or immunotherapy agents: Zometa      To help prevent nausea and vomiting after your treatment, we encourage you to take your nausea medication as directed.  BELOW ARE SYMPTOMS THAT SHOULD BE REPORTED IMMEDIATELY: *FEVER GREATER THAN 100.4 F (38 C) OR HIGHER *CHILLS OR SWEATING *NAUSEA AND VOMITING THAT IS NOT CONTROLLED WITH YOUR NAUSEA MEDICATION *UNUSUAL SHORTNESS OF BREATH *UNUSUAL BRUISING OR BLEEDING *URINARY PROBLEMS (pain or burning when urinating, or frequent urination) *BOWEL PROBLEMS (unusual diarrhea, constipation, pain near the anus) TENDERNESS IN MOUTH AND THROAT WITH OR WITHOUT PRESENCE OF ULCERS (sore throat, sores in mouth, or a toothache) UNUSUAL RASH, SWELLING OR PAIN  UNUSUAL VAGINAL DISCHARGE OR ITCHING   Items with * indicate a potential emergency and should be followed up as soon as possible or go to the Emergency Department if any problems should occur.  Please show the CHEMOTHERAPY ALERT CARD or IMMUNOTHERAPY ALERT CARD at check-in to the  Emergency Department and triage nurse.  Should you have questions after your visit or need to cancel or reschedule your appointment, please contact [facility] MEDICAL ONCOLOG[facility]  Dept: [phone]  and follow the prompts.  Office hours are 8:00 a.m. to 4:30 p.m. Monday - Friday. Please note that voicemails left after 4:00 p.m. may not be returned until the following business day.  We are closed weekends and major holidays. You have access to a nurse at all times for urgent questions. Please call the main number to the clinic Dept: [phone] and follow the prompts.   For any non-urgent questions, you may also contact your provider using MyChart. We now offer e-Visits for anyone 18 and older to request care online for non-urgent symptoms. For details visit [email or web address].   Also download the MyChart app! Go to the app store, search "MyChart", open the app, select [facility], and log in with your MyChart username and password.  Due to Covid, a mask is required upon entering the hospital/clinic. If you do not have a mask, one will be given to you upon arrival. For doctor visits, patients may have 1 support person aged 18 or older with them. For treatment visits, patients cannot have anyone with them due to current Covid guidelines and our immunocompromised population.  

## 2021-02-08 ENCOUNTER — Other Ambulatory Visit: Payer: 59

## 2021-02-08 ENCOUNTER — Ambulatory Visit: Payer: 59 | Admitting: Nurse Practitioner

## 2021-02-14 ENCOUNTER — Encounter: Payer: Self-pay | Admitting: Nurse Practitioner

## 2021-02-14 ENCOUNTER — Inpatient Hospital Stay: Payer: 59 | Attending: Nurse Practitioner | Admitting: Nurse Practitioner

## 2021-02-14 DIAGNOSIS — C50212 Malignant neoplasm of upper-inner quadrant of left female breast: Secondary | ICD-10-CM

## 2021-02-14 DIAGNOSIS — Z170 Estrogen receptor positive status [ER+]: Secondary | ICD-10-CM

## 2021-02-14 NOTE — Progress Notes (Signed)
[place]   [phone] [phone]) [phone]   Clinic Follow up Note   Patient Care Team: [name], MD as PCP - General (Family Medicine) Truitt Merle, MD as Consulting Physician (Hematology) Jovita Kussmaul, MD as Consulting Physician (General Surgery) Mauro Kaufmann, RN as Oncology Nurse Navigator Rockwell Germany, RN as Oncology Nurse Navigator Gery Pray, MD as Consulting Physician (Radiation Oncology) Alla Feeling, NP as Nurse Practitioner (Nurse Practitioner) 02/14/2021   I connected with Sierra Lara on 02/14/21 at  9:30 AM EST by telephone visit and verified that I am speaking with the correct person using two identifiers.   I discussed the limitations, risks, security and privacy concerns of performing an evaluation and management service by telemedicine and the availability of in-person appointments. I also discussed with the patient that there may be a patient responsible charge related to this service. The patient expressed understanding and agreed to proceed.   Other persons participating in the visit and their role in the encounter: None   Patients location: home Providers location: [place] office    CHIEF COMPLAINT: Follow up L breast cancer med check after changing to   [place]: Oncology History Overview Note  Cancer Staging Malignant neoplasm of upper-inner quadrant of left breast in female, estrogen receptor positive ([place]) Staging form: Breast, AJCC 8th Edition - Clinical stage from 09/22/2019: Stage IA (cT1a, cN0, cM0, G1, ER+, PR+, HER2-) - Signed by Truitt Merle, MD on 10/07/2019    Malignant neoplasm of upper-inner quadrant of left breast in female, estrogen receptor positive ([place])  09/08/2019 Mammogram   Left Diagnostic Mammogram  IMPRESSION 1.Suspicious 49m mass involving the UPPER OUTER QUADRANT of the LEFT breast at the 11:00 position approximately 6cmfn. This is the likely sonographic correlate for the  screening mammographic architectural distortion.  2. No pathologic LEFT axillary lymphadnopathy.    09/13/2019 Initial Biopsy   Diagnosis  Breast, left, needle core biopsy, 11:00 position, 6cmfn -INVASIVE LOBULAR CARCINOMA, GRADE 2, AND LOBULAR CARCINOMA IN SITU -TUMOR INVOLVES ALL CORES AND MEASURES 7MM IN MAXIMUM EXTENT IN A SINGLE CORE.  -A BREAST PROGNOSTIC PROFILE WILL BE ORDERED ON BLOCK 1A AND SEPARATELY REPORTED -See comment    09/13/2019 Receptors her2   ER 90% positive, moderately staining intensity PR 80% positive, strong staining intensity  Proliferation Marker Ki67: 2%  [facility] - [facility]  09/22/2019 Cancer Staging   Staging form: Breast, AJCC 8th Edition - Clinical stage from 09/22/2019: Stage IA (cT1a, cN0, cM0, G1, ER+, PR+, HER2-) - Signed by FTruitt Merle MD on 10/07/2019    09/22/2019 Initial Biopsy   Diagnosis 09/22/19 Breast, left, needle core biopsy, uiq - INVASIVE MAMMARY CARCINOMA, SEE COMMENT. - MAMMARY CARCINOMA IN SITU. Microscopic Comment The carcinoma appears grade 1 and measures 3 mm in greatest linear extent. E-cadherin will be ordered. Prognostic makers will be ordered. Dr. KVic Ripperhas reviewed the case. [facility].   09/22/2019 Receptors her2   PROGNOSTIC INDICATORS Results: IMMUNOHISTOCHEMICAL AND MORPHOMETRIC ANALYSIS PERFORMED MANUALLY The tumor cells are NEGATIVE for Her2 (1+). Estrogen Receptor: 95%, POSITIVE, STRONG STAINING INTENSITY Progesterone Receptor: 2%, POSITIVE, STRONG STAINING INTENSITY Proliferation Marker Ki67: 15%    10/07/2019 Initial Diagnosis   Malignant neoplasm of upper-inner quadrant of left breast in female, estrogen [place] positive [place]   10/08/2019 Imaging   MRI Breast  IMPRESSION: 1. 1.5 cm biopsy-proven invasive mammary carcinoma and mammary  carcinoma in situ in the posterior aspect of the upper inner quadrant of the left breast. 2. 0.8 cm  biopsy-proven invasive lobular carcinoma and lobular carcinoma in situ in the upper inner quadrant of the left breast in the middle 3rd. 3. The 2 areas of malignancy in the upper inner left breast span 2.8 cm on the MR images with the clips located 3 cm apart on the post clip placement mammogram images. 4. No evidence of malignancy on the right. 5. No adenopathy.   10/2019 -  Anti-estrogen oral therapy   Anastrozole 25m once daily started in 10/2019. Started before her breast surgery.    11/16/2019 Genetic Testing   Negative genetic testing on the common hereditary cancer panel.  The Common Hereditary Gene Panel offered by [facility] includes sequencing and/or deletion duplication testing of the following 48 genes: APC, ATM, AXIN2, BARD1, BMPR1A, BRCA1, BRCA2, BRIP1, CDH1, CDK4, CDKN2A (p14ARF), CDKN2A (p16INK4a), CHEK2, CTNNA1, DICER1, EPCAM (Deletion/duplication testing only), GREM1 (promoter region deletion/duplication testing only), KIT, MEN1, MLH1, MSH2, MSH3, MSH6, MUTYH, NBN, NF1, NHTL1, PALB2, PDGFRA, PMS2, POLD1, POLE, PTEN, RAD50, RAD51C, RAD51D, RNF43, SDHB, SDHC, SDHD, SMAD4, SMARCA4. STK11, TP53, TSC1, TSC2, and VHL.  The following genes were evaluated for sequence changes only: SDHA and HOXB13 c.251G>A variant only.  The report date is November 16, 2019.   11/19/2019 Surgery   LEFT BREAST LUMPECTOMY WITH RADIOACTIVE SEED AND SENTINEL LYMPH NODE BIOPSY by Dr TMarlou Starks   11/19/2019 Pathology Results   FINAL MICROSCOPIC DIAGNOSIS:   A. LYMPH NODE, LEFT AXILLARY #1, SENTINEL, EXCISION:  - One lymph node with no metastatic carcinoma (0/1).   B. LYMPH NODE, LEFT AXILLARY #2, SENTINEL, EXCISION:  - One lymph node with no metastatic carcinoma (0/1).   C. LYMPH NODE, LEFT AXILLARY #3, SENTINEL, EXCISION:  - One lymph node with no metastatic carcinoma (0/1).   D. BREAST, LEFT, LUMPECTOMY:  - Invasive and in situ lobular carcinoma, 2 tumors, 1.6 and 1.2 cm.  - Invasive lobular carcinoma 0.2  cm from the posterior and superior  margins.  - Lobular carcinoma in situ 0.1 cm from superior margin.  - Biopsy sites and biopsy clips.  - See oncology table and comment.   E. BREAST, LEFT ADDITIONAL MEDIAL INFERIOR MARGIN, EXCISION:  - Focal lobular carcinoma in situ.  - No invasive carcinoma.  - Lobular carcinoma in situ focally less than 0.1 cm from final medial  margin.    11/19/2019 Oncotype testing    Oncotype  Recurrence Score 20  Distant Recurrence risk at 9 years of 6% There is less than 1% benefit of chemotherapy.    11/19/2019 Cancer Staging   Staging form: Breast, AJCC 8th Edition - Pathologic stage from 11/19/2019: Stage IA (pT1c, pN0, cM0, G1, ER+, PR+, HER2-, Oncotype DX score: 20) - Signed by FTruitt Merle MD on 02/11/2020 Stage prefix: Initial diagnosis Multigene prognostic tests performed: Oncotype DX Recurrence score range: Greater than or equal to 11 Histologic grading system: 3 grade system Residual tumor (R): R0 - None    01/24/2020 - 02/21/2020 Radiation Therapy   Adjuvant Radiation with Dr KSondra Come  05/11/2020 Survivorship   SCP delivered by LCira Rue NP      CURRENT THERAPY: Anastrozole 1 mg daily, starting 10/2019, stopped 10/20/20 due to diffuse joint pain, low libido, dry eyes. Started completed first Zometa 11/27/20 and began Exemestane 11/2020.  INTERVAL HISTORY: Ms. BAcocellapresents for virtual med check as scheduled. Last seen in person 11/14/20. She weaned off fluvastatin in  November, then began exemestane in late 11/22. She felt well and gradually reintroduced statin taking it every other day.  Leg cramps are much improved, she continues magnesium.  She has intermittent joint aches in different places but very manageable.  Dry eyes resolved with Systane.  She lost 9 pounds intentionally changing to low-carb diet when her husband was diagnosed with diabetes.  She had 2 episodes of chills after Zometa that resolved, otherwise tolerated well.  She takes  double dose of vitamin D on Monday Wednesday Friday.  PCP visit in May. She denies other concerns.   MEDICAL HISTORY:  Past Medical History:  Diagnosis Date   Family history of breast cancer    Family history of colon cancer    Family history of prostate cancer    History of radiation therapy 01/24/2020-02/21/2020   Left Breast; Dr. Gery Pray   Hypertension    Personal history of radiation therapy    PONV (postoperative nausea and vomiting)    Skin cancer     SURGICAL HISTORY: Past Surgical History:  Procedure Laterality Date   BREAST BIOPSY Left 09/22/2019   BREAST LUMPECTOMY Left 11/19/2019   BREAST LUMPECTOMY WITH RADIOACTIVE SEED AND SENTINEL LYMPH NODE BIOPSY Left 11/19/2019   Procedure: LEFT BREAST LUMPECTOMY WITH RADIOACTIVE SEED AND SENTINEL LYMPH NODE BIOPSY;  Surgeon: Jovita Kussmaul, MD;  Location: [facility];  Service: General;  Laterality: Left;   EYE SURGERY     ROTATOR CUFF REPAIR Right     I have reviewed the social history and family history with the patient and they are unchanged from previous note.  ALLERGIES:  has No Known Allergies.  MEDICATIONS:  Current Outpatient Medications  Medication Sig Dispense Refill   amLODipine (NORVASC) 2.5 MG tablet Take 2.5 mg by mouth daily.     Cholecalciferol (VITAMIN D3 PO) Take 25 mcg by mouth daily.     exemestane (AROMASIN) 25 MG tablet Take 1 tablet (25 mg total) by mouth daily after breakfast. 90 tablet 1   ezetimibe (ZETIA) 10 MG tablet Take 10 mg by mouth daily.     fluvastatin (LESCOL) 20 MG capsule Take 1 capsule by mouth every evening. (Patient not taking: Reported on 11/14/2020)     MAGNESIUM MALATE PO Take 2 capsules by mouth daily. (Patient not taking: Reported on 11/14/2020)     No current facility-administered medications for this visit.    PHYSICAL EXAMINATION:  There were no vitals filed for this visit. There were no vitals filed for this visit.  Patient appears well over the  phone.  Voice is strong, speech is clear, mood/affect appear normal.  No cough or conversational dyspnea.  LABORATORY DATA:  No lab data for this visit   RADIOGRAPHIC STUDIES: I have personally reviewed the radiological images as listed and agreed with the findings in the report. No results found.   ASSESSMENT & PLAN: Santrice Muzio is a 66 y.o. female with    1.   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  S/p left lumpectomy with SLNB by Dr Marlou Starks on 11/19/19.  -Her Oncotype showed RS 20 low risk, with 6% recurrence risk of distant metastasis at 9 years of antiestrogen therapy alone.  Adjuvant chemo was not recommended.  She completed adjuvant radiation with Dr Sondra Come from 01/24/20 through 02/21/20.  -Mammo 08/2020 negative  -On surveillance and  AI -Given the strong ER/PR positive disease, she started on Anastrozole neoadjuvantly in 10/2019, goal is 10 years given the lobular histology if she tolerated well for 1 year until she stopped in 10/2020 due to diffuse joint pain, leg cramps, dry eyes, low libido -changed to exemestane 11/2020, tolerating well thus far. Joint aches improved, tolerable. She will continue -next surveillance visit 05/14/21   2. Bone Health, Health maintenance -She has stopped calcium due to kidney stones.  Vitamin D level of 20 on 08/10/2020, continue 25 mcg, with 50 mcg on Monday Wednesday Friday -DEXA 08/18/2020 shows osteopenia, lowest T score -2.0 at the right femur neck.  Overall DEXA has worsened slightly since 2019.   -We reviewed that AI's can contribute to low bone density -She began IV bisphosphonate with Zometa every 6 months x2 years on 11/27/2020.  He had mild chills twice after the infusion, otherwise tolerated well -Next dose 05/2021  3. Genetic testing was negative for pathogenetic mutations    4.  HTN, Weight management, HL -Per PCP -Reviewed statins can contribute to bone/joint pain and muscle cramps.  She has been off fluvastatin, reintroduced after starting exemestane, takes it QOD and tolerating well -PCP visit in 05/2021 for routine labs/wellness  PLAN: -Continue exemestane -Surveillance visit with 2nd zometa 05/14/21 -PCP routine wellness visit w labs 05/2021 -Mammo 08/2021   I discussed the assessment and treatment plan with the patient. The patient was provided an opportunity to ask questions and all were answered. The patient agreed with the plan and demonstrated an understanding of the instructions.   The patient was advised to call back or seek an in-person evaluation if the symptoms worsen or if the condition fails to improve as anticipated.   I spent 5 minutes non-face-to-face time on today's phone encounter.     Alla Feeling, NP 02/14/21

## 2021-02-27 ENCOUNTER — Encounter (HOSPITAL_COMMUNITY): Payer: Self-pay

## 2021-05-14 ENCOUNTER — Other Ambulatory Visit: Payer: Self-pay

## 2021-05-14 ENCOUNTER — Encounter: Payer: Self-pay | Admitting: Hematology

## 2021-05-14 ENCOUNTER — Inpatient Hospital Stay (HOSPITAL_BASED_OUTPATIENT_CLINIC_OR_DEPARTMENT_OTHER): Payer: 59 | Admitting: Hematology

## 2021-05-14 ENCOUNTER — Inpatient Hospital Stay: Payer: 59 | Attending: Nurse Practitioner

## 2021-05-14 ENCOUNTER — Inpatient Hospital Stay: Payer: 59

## 2021-05-14 VITALS — BP 126/81 | HR 73 | Temp 98.4°F | Resp 18 | Ht 65.0 in | Wt 160.9 lb

## 2021-05-14 DIAGNOSIS — Z170 Estrogen receptor positive status [ER+]: Secondary | ICD-10-CM | POA: Insufficient documentation

## 2021-05-14 DIAGNOSIS — Z79811 Long term (current) use of aromatase inhibitors: Secondary | ICD-10-CM | POA: Diagnosis not present

## 2021-05-14 DIAGNOSIS — I10 Essential (primary) hypertension: Secondary | ICD-10-CM | POA: Insufficient documentation

## 2021-05-14 DIAGNOSIS — M85851 Other specified disorders of bone density and structure, right thigh: Secondary | ICD-10-CM

## 2021-05-14 DIAGNOSIS — M8580 Other specified disorders of bone density and structure, unspecified site: Secondary | ICD-10-CM | POA: Insufficient documentation

## 2021-05-14 DIAGNOSIS — C50212 Malignant neoplasm of upper-inner quadrant of left female breast: Secondary | ICD-10-CM | POA: Diagnosis present

## 2021-05-14 LAB — CBC WITH DIFFERENTIAL (CANCER CENTER ONLY)
Abs Immature Granulocytes: 0.02 10*3/uL (ref 0.00–0.07)
Basophils Absolute: 0 10*3/uL (ref 0.0–0.1)
Basophils Relative: 1 %
Eosinophils Absolute: 0.1 10*3/uL (ref 0.0–0.5)
Eosinophils Relative: 3 %
HCT: 41.2 % (ref 36.0–46.0)
Hemoglobin: 14 g/dL (ref 12.0–15.0)
Immature Granulocytes: 0 %
Lymphocytes Relative: 25 %
Lymphs Abs: 1.4 10*3/uL (ref 0.7–4.0)
MCH: 30.8 pg (ref 26.0–34.0)
MCHC: 34 g/dL (ref 30.0–36.0)
MCV: 90.5 fL (ref 80.0–100.0)
Monocytes Absolute: 0.5 10*3/uL (ref 0.1–1.0)
Monocytes Relative: 9 %
Neutro Abs: 3.4 10*3/uL (ref 1.7–7.7)
Neutrophils Relative %: 62 %
Platelet Count: 195 10*3/uL (ref 150–400)
RBC: 4.55 MIL/uL (ref 3.87–5.11)
RDW: 13.9 % (ref 11.5–15.5)
WBC Count: 5.4 10*3/uL (ref 4.0–10.5)
nRBC: 0 % (ref 0.0–0.2)

## 2021-05-14 LAB — CMP (CANCER CENTER ONLY)
ALT: 39 U/L (ref 0–44)
AST: 25 U/L (ref 15–41)
Albumin: 4.3 g/dL (ref 3.5–5.0)
Alkaline Phosphatase: 65 U/L (ref 38–126)
Anion gap: 5 (ref 5–15)
BUN: 11 mg/dL (ref 8–23)
CO2: 29 mmol/L (ref 22–32)
Calcium: 9.1 mg/dL (ref 8.9–10.3)
Chloride: 104 mmol/L (ref 98–111)
Creatinine: 0.63 mg/dL (ref 0.44–1.00)
GFR, Estimated: >60 mL/min (ref >60)
Glucose, Bld: 93 mg/dL (ref 70–99)
Potassium: 4.1 mmol/L (ref 3.5–5.1)
Sodium: 138 mmol/L (ref 135–145)
Total Bilirubin: 0.5 mg/dL (ref 0.3–1.2)
Total Protein: 6.9 g/dL (ref 6.5–8.1)

## 2021-05-14 MED ORDER — SODIUM CHLORIDE 0.9 % IV SOLN: Freq: Once | INTRAVENOUS | Status: AC

## 2021-05-14 MED ORDER — ZOLEDRONIC ACID 4 MG/100ML IV SOLN
4.0000 mg | Freq: Once | INTRAVENOUS | Status: AC
  Administered 2021-05-14: 4 mg via INTRAVENOUS
  Filled 2021-05-14: qty 100

## 2021-05-14 NOTE — Progress Notes (Signed)
?[place]   ?Telephone:(336) [phone] [phone]   ?Clinic Follow up Note  ? ?Patient Care Team: ?[name], MD as PCP - General (Family Medicine) ?Truitt Merle, MD as Consulting Physician (Hematology) ?Jovita Kussmaul, MD as Consulting Physician (General Surgery) ?Mauro Kaufmann, RN as Oncology Nurse Navigator ?Rockwell Germany, RN as Oncology Nurse Navigator ?Gery Pray, MD as Consulting Physician (Radiation Oncology) ?Alla Feeling, NP as Nurse Practitioner (Nurse Practitioner) ? ?Date of Service:  05/14/2021 ? ?CHIEF COMPLAINT: f/u of left breast cancer ? ?CURRENT THERAPY:  ?Antiestrogen therapy, started 10/2019, currently exemestane started 11/2020 ? ?ASSESSMENT & PLAN:  ?Sierra Lara is a 66 y.o. post-menopausal female with  ? ?1. Malignant neoplasm of upper quadrant of left breast, invasive lobular carcinoma, and LCIS, Stage IA, pT1cN0M0, ER+/PR+/HER2-, Grade I ?-diagnosed in 08/2019. S/p left lumpectomy with SLNB by Dr Marlou Starks on 11/19/19, path showed 1.6 and 1.2cm masses of invasive lobular carcinoma and LCIS was completely removed with clear margins, LN negative. ?-Oncotype RS of 20, low risk  ?-She received adjuvant radiation with Dr Sondra Come 1/10-02/21/20.  ?-she started Anastrozole neoadjuvantly in 10/2019. She developed worsening side effects and was switched to exemestane in 11/2020.  She tolerated better with mild arthralgia.  Will continue for 10 years given lobular cancer if she tolerates well. ?-most recent mammogram on 08/21/20 was benign ?-she is clinically doing well. Labs reviewed, overall WNL. Physical exam was unremarkable. There is no clinical concern for recurrence. ?  ?2. Bone Health, Health maintenance ?-most recent DEXA 08/18/20 showed osteopenia, T-score -2.0. ?-She continues Vit D. She has stopped calcium due to kidney stones.  ?-she began Zometa on 11/27/20. She experienced mild chills after infusion, otherwise tolerated well. She will proceed with second dose  today. ?  ?3. Genetic testing was negative for pathogenetic mutations  ?  ?4. HTN, Weight management  ?-on amlodipine, follow-up with PCP. ?-She is interested in losing weight but notes she lacks motivation. ?  ?  ?PLAN:  ?-Zometa today ?-Continue exemestane daily ?-Mammogram due 08/2021 ?-labs, f/u and zoemta in 6 months ? ? ?No problem-specific Assessment & Plan notes found for this encounter. ? ? ?SUMMARY OF ONCOLOGIC HISTORY: ?Oncology History Overview Note  ?Cancer Staging ?Malignant neoplasm of upper-inner quadrant of left breast in female, estrogen receptor positive ([place]) ?Staging form: Breast, AJCC 8th Edition ?- Clinical stage from 09/22/2019: Stage IA (cT1a, cN0, cM0, G1, ER+, PR+, HER2-) - Signed by Truitt Merle, MD on 10/07/2019 ? ?  ?Malignant neoplasm of upper-inner quadrant of left breast in female, estrogen receptor positive ([place])  ?09/08/2019 Mammogram  ? Left Diagnostic Mammogram  ?IMPRESSION ?1.Suspicious 15m mass involving the UPPER OUTER QUADRANT of the LEFT breast at the 11:00 position approximately 6cmfn. This is the likely sonographic correlate for the screening mammographic architectural distortion.  ?2. No pathologic LEFT axillary lymphadnopathy.  ?  ?09/13/2019 Initial Biopsy  ? Diagnosis ? ?Breast, left, needle core biopsy, 11:00 position, 6cmfn ?-INVASIVE LOBULAR CARCINOMA, GRADE 2, AND LOBULAR CARCINOMA IN SITU ?-TUMOR INVOLVES ALL CORES AND MEASURES 7MM IN MAXIMUM EXTENT IN A SINGLE CORE.  ?-A BREAST PROGNOSTIC PROFILE WILL BE ORDERED ON BLOCK 1A AND SEPARATELY REPORTED ?-See comment  ?  ?09/13/2019 Receptors her2  ? ER 90% positive, moderately staining intensity ?PR 80% positive, strong staining intensity  ?Proliferation Marker Ki67: 2% ? ?HER2 Equivocal - HER2 Negative on FISH ?  ?09/22/2019 Cancer Staging  ? Staging form: Breast, AJCC 8th Edition ?- Clinical stage from  09/22/2019: Stage IA (cT1a, cN0, cM0, G1, ER+, PR+, HER2-) - Signed by Truitt Merle, MD on 10/07/2019 ? ?  ?09/22/2019 Initial Biopsy   ? Diagnosis 09/22/19 ?Breast, left, needle core biopsy, uiq ?- INVASIVE MAMMARY CARCINOMA, SEE COMMENT. ?- MAMMARY CARCINOMA IN SITU. ?Microscopic Comment ?The carcinoma appears grade 1 and measures 3 mm in greatest linear extent. E-cadherin will be ordered. Prognostic ?makers will be ordered. Dr. Vic Ripper has reviewed the case. The case was called to [facility] ?on 09/23/2019. ?  ?09/22/2019 Receptors her2  ? PROGNOSTIC INDICATORS ?Results: ?IMMUNOHISTOCHEMICAL AND MORPHOMETRIC ANALYSIS PERFORMED MANUALLY ?The tumor cells are NEGATIVE for Her2 (1+). ?Estrogen Receptor: 95%, POSITIVE, STRONG STAINING INTENSITY ?Progesterone Receptor: 2%, POSITIVE, STRONG STAINING INTENSITY ?Proliferation Marker Ki67: 15% ? ?  ?10/07/2019 Initial Diagnosis  ? Malignant neoplasm of upper-inner quadrant of left breast in female, estrogen receptor positive ([place]) ? ?  ?10/08/2019 Imaging  ? MRI Breast  ?IMPRESSION: ?1. 1.5 cm biopsy-proven invasive mammary carcinoma and mammary ?carcinoma in situ in the posterior aspect of the upper inner ?quadrant of the left breast. ?2. 0.8 cm biopsy-proven invasive lobular carcinoma and lobular ?carcinoma in situ in the upper inner quadrant of the left breast in ?the middle 3rd. ?3. The 2 areas of malignancy in the upper inner left breast span 2.8 ?cm on the MR images with the clips located 3 cm apart on the post ?clip placement mammogram images. ?4. No evidence of malignancy on the right. ?5. No adenopathy. ?  ?10/2019 -  Anti-estrogen oral therapy  ? Anastrozole 60m once daily started in 10/2019. Started before her breast surgery.  ?  ?11/16/2019 Genetic Testing  ? Negative genetic testing on the common hereditary cancer panel.  The Common Hereditary Gene Panel offered by [facility] includes sequencing and/or deletion duplication testing of the following 48 genes: APC, ATM, AXIN2, BARD1, BMPR1A, BRCA1, BRCA2, BRIP1, CDH1, CDK4, CDKN2A (p14ARF), CDKN2A (p16INK4a), CHEK2, CTNNA1, DICER1,  EPCAM (Deletion/duplication testing only), GREM1 (promoter region deletion/duplication testing only), KIT, MEN1, MLH1, MSH2, MSH3, MSH6, MUTYH, NBN, NF1, NHTL1, PALB2, PDGFRA, PMS2, POLD1, POLE, PTEN, RAD50, RAD51C, RAD51D, RNF43, SDHB, SDHC, SDHD, SMAD4, SMARCA4. STK11, TP53, TSC1, TSC2, and VHL.  The following genes were evaluated for sequence changes only: SDHA and HOXB13 c.251G>A variant only.  The report date is November 16, 2019. ?  ?11/19/2019 Surgery  ? LEFT BREAST LUMPECTOMY WITH RADIOACTIVE SEED AND SENTINEL LYMPH NODE BIOPSY by Dr TMarlou Starks ?  ?11/19/2019 Pathology Results  ? FINAL MICROSCOPIC DIAGNOSIS:  ? ?A. LYMPH NODE, LEFT AXILLARY #1, SENTINEL, EXCISION:  ?- One lymph node with no metastatic carcinoma (0/1).  ? ?B. LYMPH NODE, LEFT AXILLARY #2, SENTINEL, EXCISION:  ?- One lymph node with no metastatic carcinoma (0/1).  ? ?C. LYMPH NODE, LEFT AXILLARY #3, SENTINEL, EXCISION:  ?- One lymph node with no metastatic carcinoma (0/1).  ? ?D. BREAST, LEFT, LUMPECTOMY:  ?- Invasive and in situ lobular carcinoma, 2 tumors, 1.6 and 1.2 cm.  ?- Invasive lobular carcinoma 0.2 cm from the posterior and superior  ?margins.  ?- Lobular carcinoma in situ 0.1 cm from superior margin.  ?- Biopsy sites and biopsy clips.  ?- See oncology table and comment.  ? ?E. BREAST, LEFT ADDITIONAL MEDIAL INFERIOR MARGIN, EXCISION:  ?- Focal lobular carcinoma in situ.  ?- No invasive carcinoma.  ?- Lobular carcinoma in situ focally less than 0.1 cm from final medial  ?margin.  ?  ?11/19/2019 Oncotype testing  ?  ?Oncotype  ?Recurrence Score  20  ?Distant Recurrence risk at 9 years of 6% ?There is less than 1% benefit of chemotherapy.  ?  ?11/19/2019 Cancer Staging  ? Staging form: Breast, AJCC 8th Edition ?- Pathologic stage from 11/19/2019: Stage IA (pT1c, pN0, cM0, G1, ER+, PR+, HER2-, Oncotype DX score: 20) - Signed by Truitt Merle, MD on 02/11/2020 ?Stage prefix: Initial diagnosis ?Multigene prognostic tests performed: Oncotype DX ?Recurrence  score range: Greater than or equal to 11 ?Histologic grading system: 3 grade system ?Residual tumor (R): R0 - None ? ?  ?01/24/2020 - 02/21/2020 Radiation Therapy  ? Adjuvant Radiation with Dr Sondra Come ?  ?4/28/

## 2021-05-14 NOTE — Patient Instructions (Signed)

## 2021-05-31 ENCOUNTER — Other Ambulatory Visit: Payer: Self-pay

## 2021-05-31 DIAGNOSIS — Z170 Estrogen receptor positive status [ER+]: Secondary | ICD-10-CM

## 2021-05-31 MED ORDER — EXEMESTANE 25 MG PO TABS: 25.0000 mg | ORAL_TABLET | Freq: Every day | ORAL | 1 refills | Status: DC

## 2021-07-23 ENCOUNTER — Other Ambulatory Visit: Payer: Self-pay | Admitting: Hematology

## 2021-07-23 DIAGNOSIS — Z1231 Encounter for screening mammogram for malignant neoplasm of breast: Secondary | ICD-10-CM

## 2021-08-22 ENCOUNTER — Ambulatory Visit
Admission: RE | Admit: 2021-08-22 | Discharge: 2021-08-22 | Disposition: A | Discharge status: Home / Self Care | Payer: 59 | Source: Ambulatory Visit | Attending: Hematology | Admitting: Hematology

## 2021-08-22 DIAGNOSIS — Z1231 Encounter for screening mammogram for malignant neoplasm of breast: Secondary | ICD-10-CM

## 2021-08-24 ENCOUNTER — Other Ambulatory Visit: Payer: Self-pay | Admitting: Family Medicine

## 2021-08-24 DIAGNOSIS — Z9889 Other specified postprocedural states: Secondary | ICD-10-CM

## 2021-08-31 ENCOUNTER — Ambulatory Visit
Admission: RE | Admit: 2021-08-31 | Discharge: 2021-08-31 | Disposition: A | Discharge status: Home / Self Care | Payer: 59 | Source: Ambulatory Visit | Attending: Family Medicine | Admitting: Family Medicine

## 2021-08-31 DIAGNOSIS — Z9889 Other specified postprocedural states: Secondary | ICD-10-CM

## 2021-11-14 ENCOUNTER — Other Ambulatory Visit: Payer: Self-pay

## 2021-11-14 ENCOUNTER — Inpatient Hospital Stay: Payer: 59 | Attending: Hematology

## 2021-11-14 ENCOUNTER — Inpatient Hospital Stay: Payer: 59

## 2021-11-14 ENCOUNTER — Inpatient Hospital Stay (HOSPITAL_BASED_OUTPATIENT_CLINIC_OR_DEPARTMENT_OTHER): Payer: 59 | Admitting: Hematology

## 2021-11-14 ENCOUNTER — Encounter: Payer: Self-pay | Admitting: Hematology

## 2021-11-14 VITALS — BP 126/77 | HR 74 | Temp 97.7°F | Resp 16 | Wt 164.4 lb

## 2021-11-14 DIAGNOSIS — M8580 Other specified disorders of bone density and structure, unspecified site: Secondary | ICD-10-CM | POA: Diagnosis not present

## 2021-11-14 DIAGNOSIS — Z170 Estrogen receptor positive status [ER+]: Secondary | ICD-10-CM | POA: Diagnosis not present

## 2021-11-14 DIAGNOSIS — I10 Essential (primary) hypertension: Secondary | ICD-10-CM | POA: Insufficient documentation

## 2021-11-14 DIAGNOSIS — C50212 Malignant neoplasm of upper-inner quadrant of left female breast: Secondary | ICD-10-CM | POA: Diagnosis present

## 2021-11-14 DIAGNOSIS — Z79811 Long term (current) use of aromatase inhibitors: Secondary | ICD-10-CM | POA: Diagnosis not present

## 2021-11-14 DIAGNOSIS — M85851 Other specified disorders of bone density and structure, right thigh: Secondary | ICD-10-CM

## 2021-11-14 LAB — CBC WITH DIFFERENTIAL/PLATELET
Abs Immature Granulocytes: 0.01 10*3/uL (ref 0.00–0.07)
Basophils Absolute: 0.1 10*3/uL (ref 0.0–0.1)
Basophils Relative: 1 %
Eosinophils Absolute: 0.1 10*3/uL (ref 0.0–0.5)
Eosinophils Relative: 2 %
HCT: 43.6 % (ref 36.0–46.0)
Hemoglobin: 14.7 g/dL (ref 12.0–15.0)
Immature Granulocytes: 0 %
Lymphocytes Relative: 27 %
Lymphs Abs: 1.4 10*3/uL (ref 0.7–4.0)
MCH: 31.4 pg (ref 26.0–34.0)
MCHC: 33.7 g/dL (ref 30.0–36.0)
MCV: 93.2 fL (ref 80.0–100.0)
Monocytes Absolute: 0.4 10*3/uL (ref 0.1–1.0)
Monocytes Relative: 7 %
Neutro Abs: 3.2 10*3/uL (ref 1.7–7.7)
Neutrophils Relative %: 63 %
Platelets: 194 10*3/uL (ref 150–400)
RBC: 4.68 MIL/uL (ref 3.87–5.11)
RDW: 12.7 % (ref 11.5–15.5)
WBC: 5.1 10*3/uL (ref 4.0–10.5)
nRBC: 0 % (ref 0.0–0.2)

## 2021-11-14 LAB — COMPREHENSIVE METABOLIC PANEL WITH GFR
ALT: 30 U/L (ref 0–44)
AST: 24 U/L (ref 15–41)
Albumin: 4.3 g/dL (ref 3.5–5.0)
Alkaline Phosphatase: 53 U/L (ref 38–126)
Anion gap: 5 (ref 5–15)
BUN: 15 mg/dL (ref 8–23)
CO2: 28 mmol/L (ref 22–32)
Calcium: 8.9 mg/dL (ref 8.9–10.3)
Chloride: 105 mmol/L (ref 98–111)
Creatinine, Ser: 0.65 mg/dL (ref 0.44–1.00)
GFR, Estimated: >60 mL/min
Glucose, Bld: 90 mg/dL (ref 70–99)
Potassium: 4 mmol/L (ref 3.5–5.1)
Sodium: 138 mmol/L (ref 135–145)
Total Bilirubin: 0.5 mg/dL (ref 0.3–1.2)
Total Protein: 7.1 g/dL (ref 6.5–8.1)

## 2021-11-14 LAB — VITAMIN D 25 HYDROXY (VIT D DEFICIENCY, FRACTURES): Vit D, 25-Hydroxy: 25.22 ng/mL — ABNORMAL LOW (ref 30–100)

## 2021-11-14 MED ORDER — TAMOXIFEN CITRATE 20 MG PO TABS: 20.0000 mg | ORAL_TABLET | Freq: Every day | ORAL | 3 refills | Status: DC

## 2021-11-14 MED ORDER — SODIUM CHLORIDE 0.9 % IV SOLN: Freq: Once | INTRAVENOUS | Status: AC

## 2021-11-14 MED ORDER — ZOLEDRONIC ACID 4 MG/100ML IV SOLN
4.0000 mg | Freq: Once | INTRAVENOUS | Status: AC
  Administered 2021-11-14: 4 mg via INTRAVENOUS
  Filled 2021-11-14: qty 100

## 2021-11-14 NOTE — Progress Notes (Signed)
[place]   [phone] [phone]) [phone]   Clinic Follow up Note   Patient Care Team: [name], MD as PCP - General (Family Medicine) Truitt Merle, MD as Consulting Physician (Hematology) Jovita Kussmaul, MD as Consulting Physician (General Surgery) Mauro Kaufmann, RN as Oncology Nurse Navigator Rockwell Germany, RN as Oncology Nurse Navigator Gery Pray, MD as Consulting Physician (Radiation Oncology) Alla Feeling, NP as Nurse Practitioner (Nurse Practitioner)  Date of Service:  11/14/2021  CHIEF COMPLAINT: f/u of left breast cancer  CURRENT THERAPY:  Antiestrogen therapy, started 10/2019, switched to tamoxifen 11/14/21  ASSESSMENT & PLAN:  Sierra Lara is a 66 y.o. post-menopausal female with   1. 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 S/p left lumpectomy with SLNB by Dr Marlou Starks on 11/19/19, path showed 1.6 and 1.2cm masses of invasive lobular carcinoma and LCIS was completely removed with clear margins, LN negative. -Oncotype RS of 20, low risk  -s/p adjuvant radiation with Dr Sondra Come 1/10-02/21/20.  -she started Anastrozole neoadjuvantly in 10/2019. She developed worsening side effects and was switched to exemestane in 11/2020. She initially tolerated better but experienced increasing joint pain. I recommend switching to tamoxifen, which causes the least amount of joint pain. --The potential side effects, which includes but not limited to, hot flash, skin and vaginal dryness, slightly increased risk of cardiovascular disease and cataract, small risk of thrombosis and endometrial cancer, were discussed with her in great details. Preventive strategies for thrombosis, such as being physically active, using compression stocks, avoid cigarette smoking, etc., were reviewed with her. I also recommend her to follow-up with her gynecologist  once a year, and watch for vaginal spotting or bleeding, as a clinically sign of endometrial cancer, etc. She voiced good understanding, and agrees to proceed. Will continue for a total of 10 years given lobular cancer if she tolerates well. -most recent mammogram on 08/31/21 was benign -she is clinically doing well. Labs reviewed, overall WNL. Physical exam was unremarkable. There is no clinical concern for recurrence.   2. Osteopenia, Health maintenance -most recent DEXA 08/18/20 showed osteopenia, T-score -2.0. -She continues Vit D. She has stopped calcium due to kidney stones.  -she began Zometa on 11/27/20. She experienced mild chills after infusion, otherwise tolerated well. She will proceed with third dose today.   3. Genetic testing was negative for pathogenetic mutations    4. HTN, Weight management  -on amlodipine, follow-up with PCP.     PLAN:  -Zometa today -stop exemestane, switch to tamoxifen -labs, f/u and zoemta in 6 months -Mammogram due 08/2022   No problem-specific Assessment & Plan notes found for this encounter.   SUMMARY OF ONCOLOGIC HISTORY: Oncology History Overview Note  Cancer Staging Malignant neoplasm of upper-inner quadrant of left breast in female, estrogen receptor positive ([place]) Staging form: Breast, AJCC 8th Edition - Clinical stage from 09/22/2019: Stage IA (cT1a, cN0, cM0, G1, ER+, PR+, HER2-) - Signed by Truitt Merle, MD on 10/07/2019    Malignant neoplasm of upper-inner quadrant of left breast in female, estrogen receptor positive ([place])  09/08/2019 Mammogram   Left Diagnostic Mammogram  IMPRESSION 1.Suspicious 70m mass involving the UPPER OUTER QUADRANT of the LEFT breast at the 11:00 position approximately 6cmfn. This is the likely sonographic correlate for the screening mammographic architectural distortion.  2. No pathologic LEFT axillary lymphadnopathy.    09/13/2019 Initial Biopsy   Diagnosis  Breast, left, needle core biopsy, 11:00 position,  6cmfn -INVASIVE LOBULAR CARCINOMA, GRADE 2, AND LOBULAR CARCINOMA IN SITU -TUMOR INVOLVES ALL CORES AND MEASURES 7MM IN MAXIMUM EXTENT IN A SINGLE CORE.  -A BREAST PROGNOSTIC PROFILE WILL BE ORDERED ON BLOCK 1A AND SEPARATELY REPORTED -See comment    09/13/2019 Receptors her2   ER 90% positive, moderately staining intensity PR 80% positive, strong staining intensity  Proliferation Marker Ki67: 2%  HER2 Equivocal - HER2 Negative on [facility]   09/22/2019 Cancer Staging   Staging form: Breast, AJCC 8th Edition - Clinical stage from 09/22/2019: Stage IA (cT1a, cN0, cM0, G1, ER+, PR+, HER2-) - Signed by Truitt Merle, MD on 10/07/2019   09/22/2019 Initial Biopsy   Diagnosis 09/22/19 Breast, left, needle core biopsy, uiq - INVASIVE MAMMARY CARCINOMA, SEE COMMENT. - MAMMARY CARCINOMA IN SITU. Microscopic Comment The carcinoma appears grade 1 and measures 3 mm in greatest linear extent. E-cadherin will be ordered. Prognostic makers will be ordered. Dr. Vic Ripper has reviewed the case. The case was called to [facility] on 09/23/2019.   09/22/2019 Receptors her2   PROGNOSTIC INDICATORS Results: IMMUNOHISTOCHEMICAL AND MORPHOMETRIC ANALYSIS PERFORMED MANUALLY The tumor cells are NEGATIVE for Her2 (1+). Estrogen Receptor: 95%, POSITIVE, STRONG STAINING INTENSITY Progesterone Receptor: 2%, POSITIVE, STRONG STAINING INTENSITY Proliferation Marker Ki67: 15%    10/07/2019 Initial Diagnosis   Malignant neoplasm of upper-inner quadrant of left breast in female, estrogen receptor positive ([place])   10/08/2019 Imaging   MRI Breast  IMPRESSION: 1. 1.5 cm biopsy-proven invasive mammary carcinoma and mammary carcinoma in situ in the posterior aspect of the upper inner quadrant of the left breast. 2. 0.8 cm biopsy-proven invasive lobular carcinoma and lobular carcinoma in situ in the upper inner quadrant of the left breast in the middle 3rd. 3. The 2 areas of malignancy in the upper inner left  breast span 2.8 cm on the MR images with the clips located 3 cm apart on the post clip placement mammogram images. 4. No evidence of malignancy on the right. 5. No adenopathy.   10/2019 -  Anti-estrogen oral therapy   Anastrozole 77m once daily started in 10/2019. Started before her breast surgery.    11/16/2019 Genetic Testing   Negative genetic testing on the common hereditary cancer panel.  The Common Hereditary Gene Panel offered by [facility] includes sequencing and/or deletion duplication testing of the following 48 genes: APC, ATM, AXIN2, BARD1, BMPR1A, BRCA1, BRCA2, BRIP1, CDH1, CDK4, CDKN2A (p14ARF), CDKN2A (p16INK4a), CHEK2, CTNNA1, DICER1, EPCAM (Deletion/duplication testing only), GREM1 (promoter region deletion/duplication testing only), KIT, MEN1, MLH1, MSH2, MSH3, MSH6, MUTYH, NBN, NF1, NHTL1, PALB2, PDGFRA, PMS2, POLD1, POLE, PTEN, RAD50, RAD51C, RAD51D, RNF43, SDHB, SDHC, SDHD, SMAD4, SMARCA4. STK11, TP53, TSC1, TSC2, and VHL.  The following genes were evaluated for sequence changes only: SDHA and HOXB13 c.251G>A variant only.  The report date is November 16, 2019.   11/19/2019 Surgery   LEFT BREAST LUMPECTOMY WITH RADIOACTIVE SEED AND SENTINEL LYMPH NODE BIOPSY by Dr TMarlou Starks   11/19/2019 Pathology Results   FINAL MICROSCOPIC DIAGNOSIS:   A. LYMPH NODE, LEFT AXILLARY #1, SENTINEL, EXCISION:  - One lymph node with no metastatic carcinoma (0/1).   B. LYMPH NODE, LEFT AXILLARY #2, SENTINEL, EXCISION:  - One lymph node with no metastatic carcinoma (0/1).   C. LYMPH NODE, LEFT AXILLARY #3, SENTINEL, EXCISION:  - One lymph node with no metastatic carcinoma (0/1).   D. BREAST, LEFT, LUMPECTOMY:  - Invasive and in situ lobular carcinoma,  2 tumors, 1.6 and 1.2 cm.  - Invasive lobular carcinoma 0.2 cm from the posterior and superior  margins.  - Lobular carcinoma in situ 0.1 cm from superior margin.  - Biopsy sites and biopsy clips.  - See oncology table and comment.   E. BREAST,  LEFT ADDITIONAL MEDIAL INFERIOR MARGIN, EXCISION:  - Focal lobular carcinoma in situ.  - No invasive carcinoma.  - Lobular carcinoma in situ focally less than 0.1 cm from final medial  margin.    11/19/2019 Oncotype testing    Oncotype  Recurrence Score 20  Distant Recurrence risk at 9 years of 6% There is less than 1% benefit of chemotherapy.    11/19/2019 Cancer Staging   Staging form: Breast, AJCC 8th Edition - Pathologic stage from 11/19/2019: Stage IA (pT1c, pN0, cM0, G1, ER+, PR+, HER2-, Oncotype DX score: 20) - Signed by Truitt Merle, MD on 02/11/2020 Stage prefix: Initial diagnosis Multigene prognostic tests performed: Oncotype DX Recurrence score range: Greater than or equal to 11 Histologic grading system: 3 grade system Residual tumor (R): R0 - None   01/24/2020 - 02/21/2020 Radiation Therapy   Adjuvant Radiation with Dr Sondra Come   05/11/2020 Survivorship   SCP delivered by Cira Rue, NP       INTERVAL HISTORY:  Sierra Lara is here for a follow up of breast cancer. She was last seen by me on 05/14/21. She presents to the clinic alone. She reports she is doing well overall. She does report some joint pain, for which she takes ibuprofen. She tells me she held the exemestane since ~07/2021, and her joint pain has improved.   All other systems were reviewed with the patient and are negative.  MEDICAL HISTORY:  Past Medical History:  Diagnosis Date   Family history of breast cancer    Family history of colon cancer    Family history of prostate cancer    History of radiation therapy 01/24/2020-02/21/2020   Left Breast; Dr. Gery Pray   Hypertension    Personal history of radiation therapy    PONV (postoperative nausea and vomiting)    Skin cancer     SURGICAL HISTORY: Past Surgical History:  Procedure Laterality Date   BREAST BIOPSY Left 09/22/2019   BREAST LUMPECTOMY Left 11/19/2019   BREAST LUMPECTOMY WITH RADIOACTIVE SEED AND SENTINEL LYMPH NODE BIOPSY Left  11/19/2019   Procedure: LEFT BREAST LUMPECTOMY WITH RADIOACTIVE SEED AND SENTINEL LYMPH NODE BIOPSY;  Surgeon: Jovita Kussmaul, MD;  Location: [facility];  Service: General;  Laterality: Left;   EYE SURGERY     ROTATOR CUFF REPAIR Right     I have reviewed the social history and family history with the patient and they are unchanged from previous note.  ALLERGIES:  has No Known Allergies.  MEDICATIONS:  Current Outpatient Medications  Medication Sig Dispense Refill   tamoxifen (NOLVADEX) 20 MG tablet Take 1 tablet (20 mg total) by mouth daily. 30 tablet 3   amLODipine (NORVASC) 2.5 MG tablet Take 2.5 mg by mouth daily.     Cholecalciferol (VITAMIN D3 PO) Take 25 mcg by mouth daily.     ezetimibe (ZETIA) 10 MG tablet Take 10 mg by mouth daily.     fluvastatin (LESCOL) 20 MG capsule Take 1 capsule by mouth every evening. (Patient not taking: Reported on 11/14/2020)     MAGNESIUM MALATE PO Take 2 capsules by mouth daily. (Patient not taking: Reported on 11/14/2020)     No current facility-administered medications for  this visit.   Facility-Administered Medications Ordered in Other Visits  Medication Dose Route Frequency Provider Last Rate Last Admin   Zoledronic Acid (ZOMETA) IVPB 4 mg  4 mg Intravenous Once Alla Feeling, NP 400 mL/hr at 11/14/21 1127 4 mg at 11/14/21 1127    PHYSICAL EXAMINATION: ECOG PERFORMANCE STATUS: 1 - Symptomatic but completely ambulatory  Vitals:   11/14/21 1018  BP: 126/77  Pulse: 74  Resp: 16  Temp: 97.7 F (36.5 C)  SpO2: 98%   Wt Readings from Last 3 Encounters:  11/14/21 164 lb 6 oz (74.6 kg)  05/14/21 160 lb 14.4 oz (73 kg)  11/27/20 170 lb (77.1 kg)     GENERAL:alert, no distress and comfortable SKIN: skin color, texture, turgor are normal, no rashes or significant lesions EYES: normal, Conjunctiva are pink and non-injected, sclera clear  NECK: supple, thyroid normal size, non-tender, without nodularity LYMPH:  no palpable  lymphadenopathy in the cervical, axillary LUNGS: clear to auscultation and percussion with normal breathing effort HEART: regular rate & rhythm and no murmurs and no lower extremity edema ABDOMEN:abdomen soft, non-tender and normal bowel sounds Musculoskeletal:no cyanosis of digits and no clubbing  NEURO: alert & oriented x 3 with fluent speech, no focal motor/sensory deficits BREAST: No palpable mass, nodules or adenopathy bilaterally. Breast exam benign.   LABORATORY DATA:  I have reviewed the data as listed    Latest Ref Rng & Units 11/14/2021   10:34 AM 05/14/2021    9:59 AM 11/14/2020    9:42 AM  CBC  WBC 4.0 - 10.5 K/uL 5.1  5.4  4.7   Hemoglobin 12.0 - 15.0 g/dL 14.7  14.0  13.4   Hematocrit 36.0 - 46.0 % 43.6  41.2  40.5   Platelets 150 - 400 K/uL 194  195  197         Latest Ref Rng & Units 11/14/2021   10:34 AM 05/14/2021    9:59 AM 11/14/2020    9:42 AM  CMP  Glucose 70 - 99 mg/dL 90  93  113   BUN 8 - 23 mg/dL _0 Creatinine 0.44 - 1.00 mg/dL 0.65  0.63  0.72   Sodium 135 - 145 mmol/L 138  138  140   Potassium 3.5 - 5.1 mmol/L 4.0  4.1  4.1   Chloride 98 - 111 mmol/L 105  104  106   CO2 22 - 32 mmol/L _1 Calcium 8.9 - 10.3 mg/dL 8.9  9.1  9.2   Total Protein 6.5 - 8.1 g/dL 7.1  6.9  6.9   Total Bilirubin 0.3 - 1.2 mg/dL 0.5  0.5  0.5   Alkaline Phos 38 - 126 U/L 53  65  96   AST 15 - 41 U/L _2 ALT 0 - 44 U/L 30  39  46       RADIOGRAPHIC STUDIES: I have personally reviewed the radiological images as listed and agreed with the findings in the report. No results found.    Orders Placed This Encounter  Procedures   CBC with Differential/Platelet    Standing Status:   Standing    Number of Occurrences:   50    Standing Expiration Date:   11/15/2022   Comprehensive metabolic panel    Standing Status:   Standing    Number of Occurrences:   50    Standing Expiration Date:  11/15/2022   All questions were answered. The patient  knows to call the clinic with any problems, questions or concerns. No barriers to learning was detected. The total time spent in the appointment was 30 minutes.     Truitt Merle, MD 11/14/2021   I, Wilburn Mylar, am acting as scribe for Truitt Merle, MD.   I have reviewed the above documentation for accuracy and completeness, and I agree with the above.

## 2021-11-14 NOTE — Patient Instructions (Signed)

## 2022-01-02 IMAGING — MG MM PLC BREAST LOC DEV 1ST LESION INC MAMMO GUIDE*L*
8 of 12 series · 8 of 12 positions shown · non-contrast
Comparison: Previous exam(s).

CLINICAL DATA: 64-year-old female with recent diagnosis of invasive
lobular carcinoma in the upper inner left breast at site ribbon
shaped biopsy marking clip and an additional site of invasive
mammary carcinoma in the upper inner left breast at site of coil
shaped biopsy marking clip. Patient presents for preoperative
radioactive seed localization.

EXAM:
MAMMOGRAPHIC GUIDED RADIOACTIVE SEED LOCALIZATION OF THE LEFT BREAST

[L ML (1 of 7)]
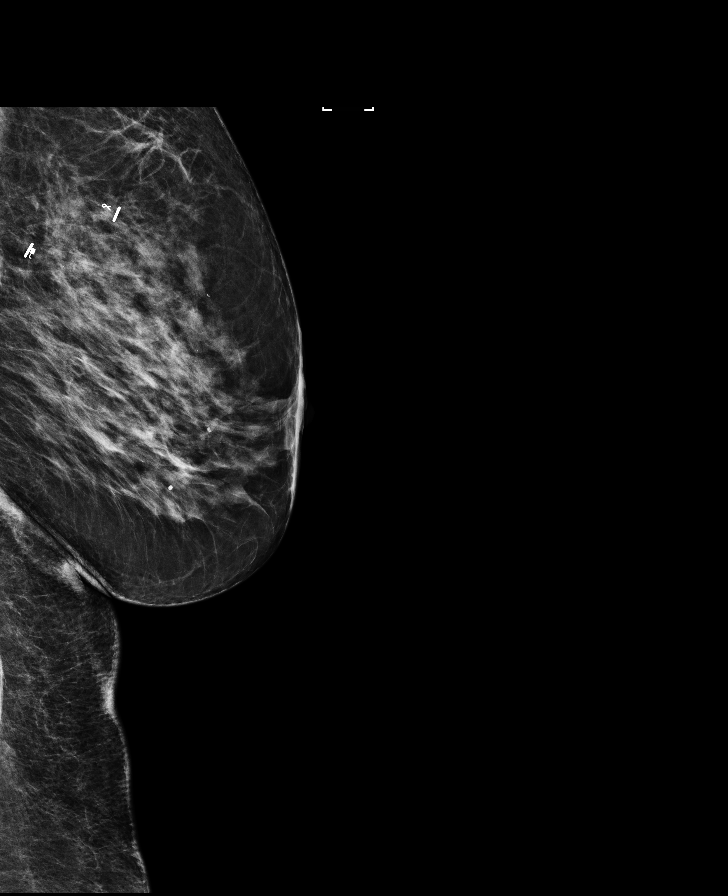

[L ML (2 of 7)]
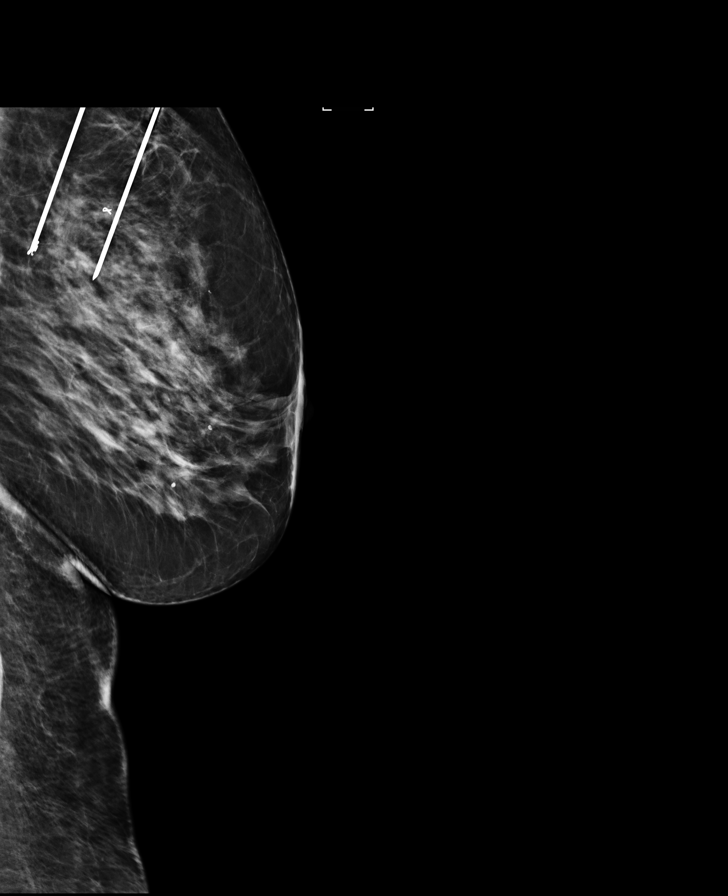

[L ML (3 of 7)]
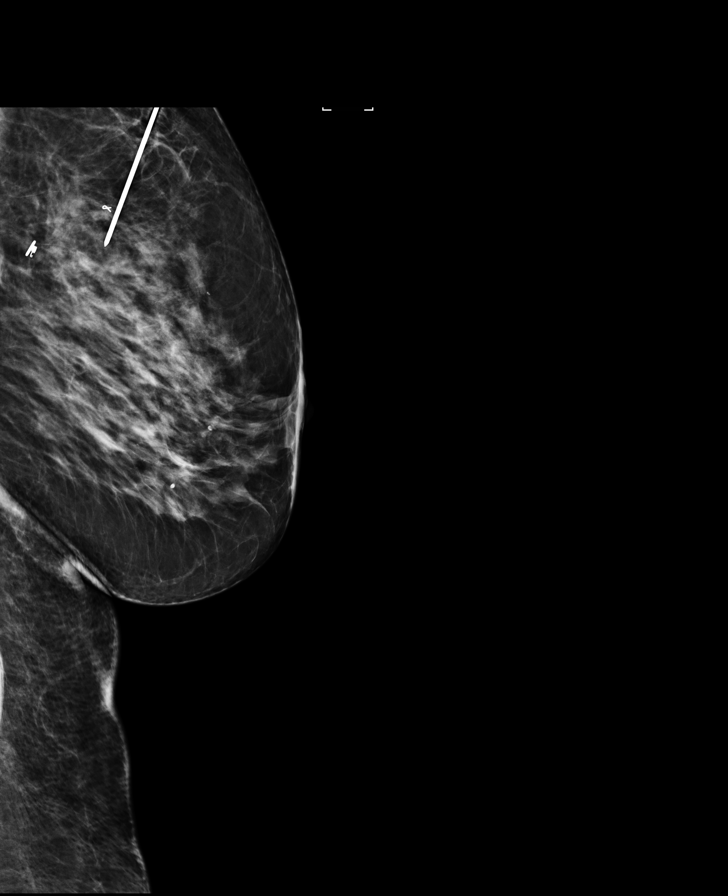

[L ML (4 of 7)]
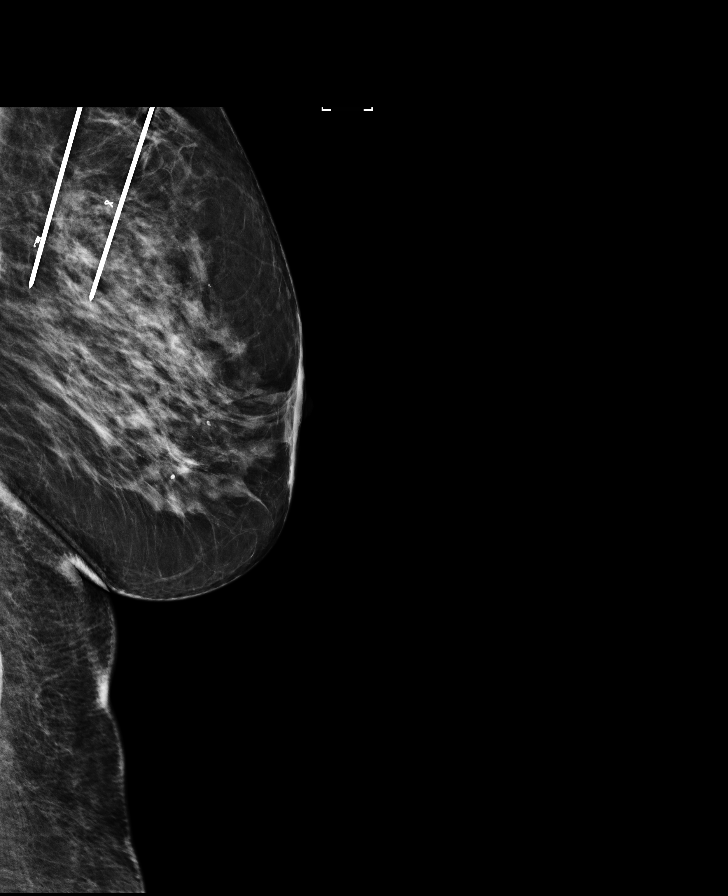

[L ML (5 of 7)]
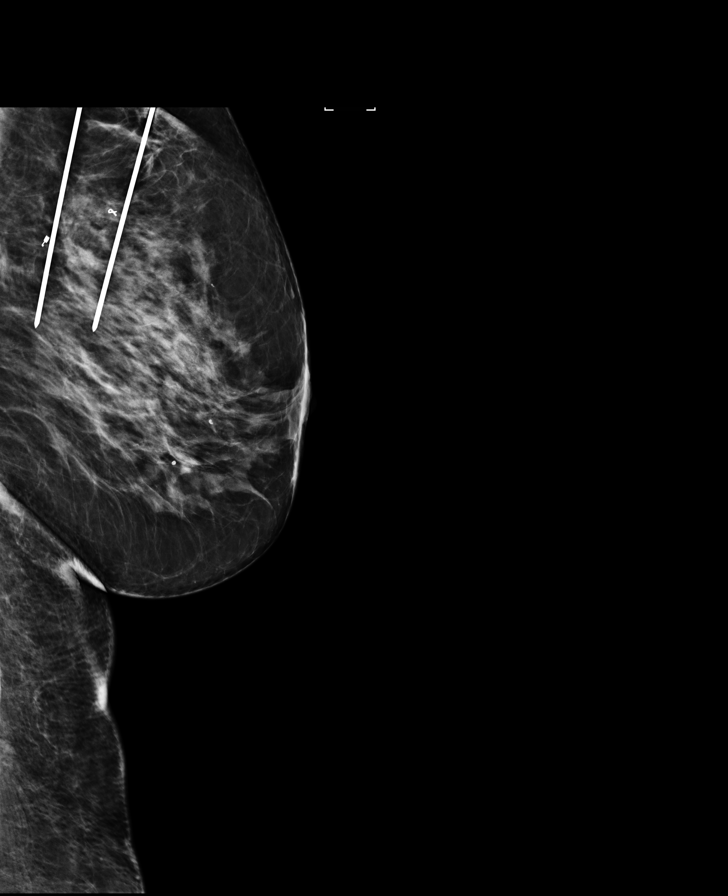

[L ML (6 of 7)]
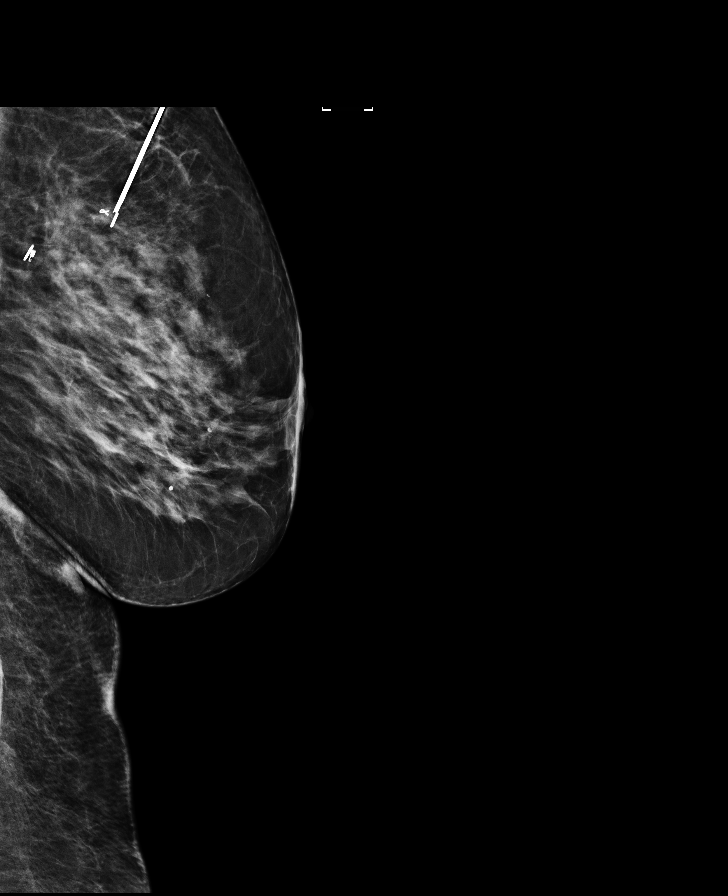

[L ML (7 of 7)]
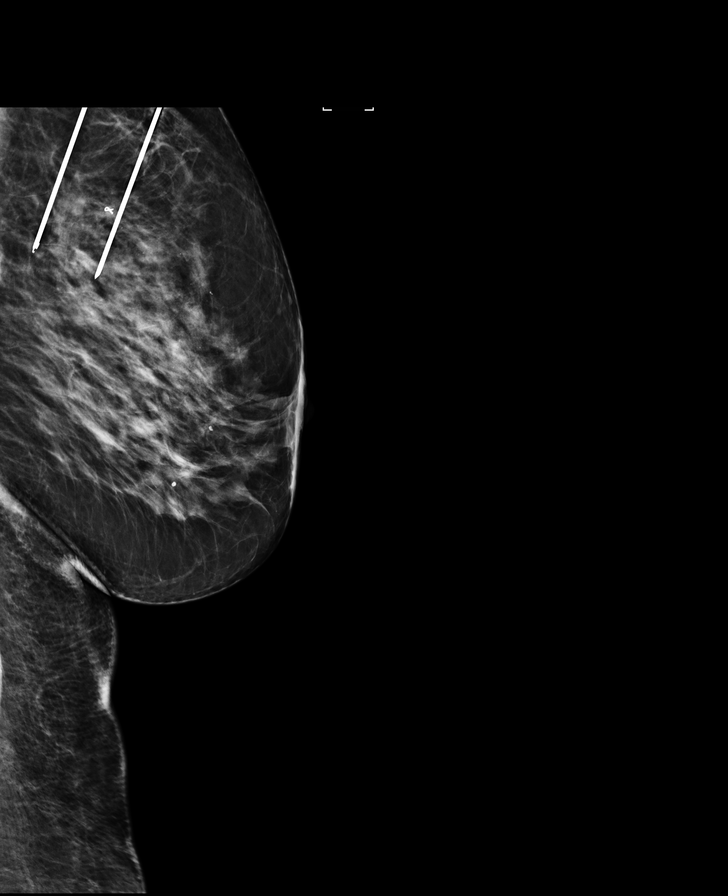

[L CC]
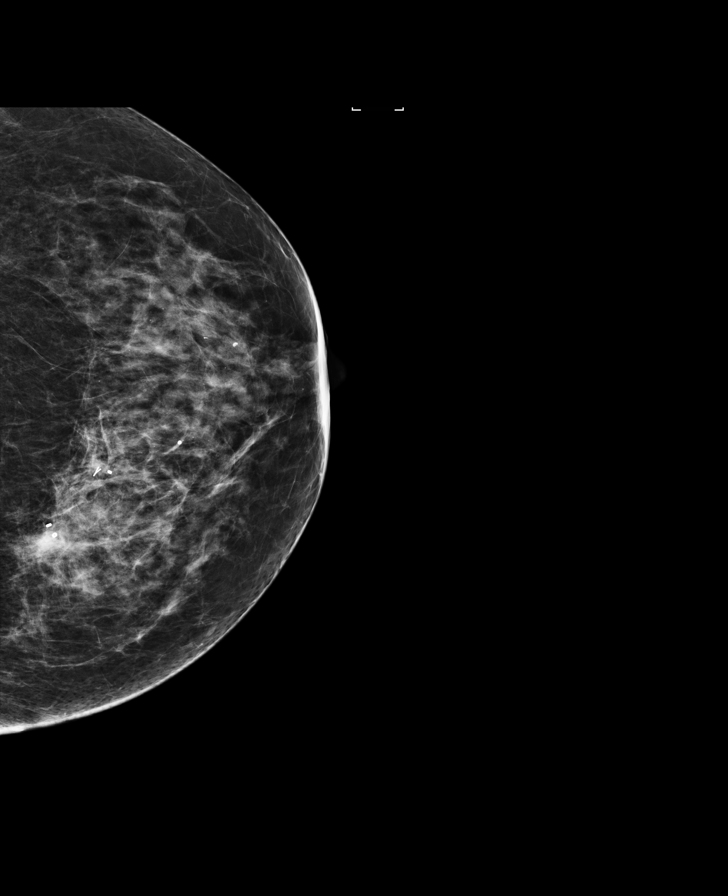

[8 of 12 positions shown; findings below may reference images not displayed]

FINDINGS: Patient presents for radioactive seed localization prior to left
breast lumpectomy. I met with the patient and we discussed the
procedure of seed localization including benefits and alternatives.
We discussed the high likelihood of a successful procedure. We
discussed the risks of the procedure including infection, bleeding,
tissue injury and further surgery. We discussed the low dose of
radioactivity involved in the procedure. Informed, written consent
was given.

The usual time-out protocol was performed immediately prior to the
procedure.

LEFT BREAST UPPER INNER POSTERIOR COIL CLIP: Using mammographic
guidance, sterile technique, 1% lidocaine and an [id number] radioactive
seed, the coil shaped biopsy marking clip with associated distortion
was localized using a superior to inferior approach. The follow-up
mammogram images confirm the seed in the expected location and were
marked for Dr. Ayofe.

Follow-up survey of the patient confirms presence of the radioactive
seed.

Order number of [id number] seed:  [id number].

Total activity:  0.248 millicuries reference Date: 10/15/2019

LEFT BREAST UPPER INNER MID DEPTH RIBBON CLIP: Using mammographic
guidance, sterile technique, 1% lidocaine and an J-[id number] radioactive
seed, the ribbon shaped biopsy marking clip was localized using a
superior to inferior approach. The follow-up mammogram images
confirm the seed in the expected location and were marked for Dr.
Ayofe.

Follow-up survey of the patient confirms presence of the radioactive
seed.

Order number of [id number] seed:  [id number].

Total activity:  0.248 millicuries reference Date: 10/15/2019

The patient tolerated the procedure well and was released from the
[REDACTED]. She was given instructions regarding seed removal.
IMPRESSION: Radioactive seed localization 2 sites left breast. No apparent
complications.

## 2022-02-11 ENCOUNTER — Other Ambulatory Visit: Payer: Self-pay | Admitting: *Deleted

## 2022-02-11 ENCOUNTER — Encounter: Payer: Self-pay | Admitting: Nurse Practitioner

## 2022-02-11 MED ORDER — TAMOXIFEN CITRATE 20 MG PO TABS: 20.0000 mg | ORAL_TABLET | Freq: Every day | ORAL | 1 refills | Status: DC

## 2022-05-16 ENCOUNTER — Inpatient Hospital Stay: Payer: 59 | Attending: Hematology

## 2022-05-16 ENCOUNTER — Encounter: Payer: Self-pay | Admitting: Hematology

## 2022-05-16 ENCOUNTER — Inpatient Hospital Stay: Payer: 59

## 2022-05-16 ENCOUNTER — Inpatient Hospital Stay (HOSPITAL_BASED_OUTPATIENT_CLINIC_OR_DEPARTMENT_OTHER): Payer: 59 | Admitting: Hematology

## 2022-05-16 VITALS — BP 135/75 | HR 75 | Temp 98.2°F | Resp 15 | Ht 65.0 in | Wt 167.1 lb

## 2022-05-16 DIAGNOSIS — Z79810 Long term (current) use of selective estrogen receptor modulators (SERMs): Secondary | ICD-10-CM | POA: Diagnosis not present

## 2022-05-16 DIAGNOSIS — Z170 Estrogen receptor positive status [ER+]: Secondary | ICD-10-CM | POA: Insufficient documentation

## 2022-05-16 DIAGNOSIS — C50212 Malignant neoplasm of upper-inner quadrant of left female breast: Secondary | ICD-10-CM | POA: Diagnosis not present

## 2022-05-16 DIAGNOSIS — E559 Vitamin D deficiency, unspecified: Secondary | ICD-10-CM

## 2022-05-16 DIAGNOSIS — M8580 Other specified disorders of bone density and structure, unspecified site: Secondary | ICD-10-CM | POA: Diagnosis not present

## 2022-05-16 DIAGNOSIS — M85851 Other specified disorders of bone density and structure, right thigh: Secondary | ICD-10-CM

## 2022-05-16 LAB — COMPREHENSIVE METABOLIC PANEL
ALT: 25 U/L (ref 0–44)
AST: 22 U/L (ref 15–41)
Albumin: 4.1 g/dL (ref 3.5–5.0)
Alkaline Phosphatase: 50 U/L (ref 38–126)
Anion gap: 7 (ref 5–15)
BUN: 15 mg/dL (ref 8–23)
CO2: 26 mmol/L (ref 22–32)
Calcium: 9.1 mg/dL (ref 8.9–10.3)
Chloride: 106 mmol/L (ref 98–111)
Creatinine, Ser: 0.66 mg/dL (ref 0.44–1.00)
GFR, Estimated: >60 mL/min (ref >60)
Glucose, Bld: 116 mg/dL — ABNORMAL HIGH (ref 70–99)
Potassium: 4 mmol/L (ref 3.5–5.1)
Sodium: 139 mmol/L (ref 135–145)
Total Bilirubin: 0.3 mg/dL (ref 0.3–1.2)
Total Protein: 6.6 g/dL (ref 6.5–8.1)

## 2022-05-16 LAB — CBC WITH DIFFERENTIAL/PLATELET
Abs Immature Granulocytes: 0.02 10*3/uL (ref 0.00–0.07)
Basophils Absolute: 0.1 10*3/uL (ref 0.0–0.1)
Basophils Relative: 1 %
Eosinophils Absolute: 0.2 10*3/uL (ref 0.0–0.5)
Eosinophils Relative: 3 %
HCT: 40.3 % (ref 36.0–46.0)
Hemoglobin: 13.5 g/dL (ref 12.0–15.0)
Immature Granulocytes: 0 %
Lymphocytes Relative: 31 %
Lymphs Abs: 1.9 10*3/uL (ref 0.7–4.0)
MCH: 31 pg (ref 26.0–34.0)
MCHC: 33.5 g/dL (ref 30.0–36.0)
MCV: 92.6 fL (ref 80.0–100.0)
Monocytes Absolute: 0.4 10*3/uL (ref 0.1–1.0)
Monocytes Relative: 7 %
Neutro Abs: 3.4 10*3/uL (ref 1.7–7.7)
Neutrophils Relative %: 58 %
Platelets: 179 10*3/uL (ref 150–400)
RBC: 4.35 MIL/uL (ref 3.87–5.11)
RDW: 13.2 % (ref 11.5–15.5)
WBC: 6 10*3/uL (ref 4.0–10.5)
nRBC: 0 % (ref 0.0–0.2)

## 2022-05-16 MED ORDER — ZOLEDRONIC ACID 4 MG/100ML IV SOLN
4.0000 mg | Freq: Once | INTRAVENOUS | Status: AC
  Administered 2022-05-16: 4 mg via INTRAVENOUS
  Filled 2022-05-16: qty 100

## 2022-05-16 MED ORDER — SODIUM CHLORIDE 0.9 % IV SOLN: Freq: Once | INTRAVENOUS | Status: AC

## 2022-05-16 NOTE — Assessment & Plan Note (Signed)
invasive lobular carcinoma, and LCIS, Stage IA, pT1cN0M0, ER+/PR+/HER2-, Grade I -diagnosed in 08/2019. S/p left lumpectomy with SLNB by Dr Carolynne Edouard on 11/19/19, path showed 1.6 and 1.2cm masses of invasive lobular carcinoma and LCIS was completely removed with clear margins, LN negative. -Oncotype RS of 20, low risk  -s/p adjuvant radiation with Dr Roselind Messier 1/10-02/21/20.  -she started Anastrozole neoadjuvantly in 10/2019. She developed worsening side effects and was switched to exemestane in 11/2020. She initially tolerated better but experienced increasing joint pain. I recommend switching to tamoxifen, which causes the least amount of joint pain. --The potential side effects, which includes but not limited to, hot flash, skin and vaginal dryness, slightly increased risk of cardiovascular disease and cataract, small risk of thrombosis and endometrial cancer, were discussed with her in great details. Preventive strategies for thrombosis, such as being physically active, using compression stocks, avoid cigarette smoking, etc., were reviewed with her. I also recommend her to follow-up with her gynecologist once a year, and watch for vaginal spotting or bleeding, as a clinically sign of endometrial cancer, etc. She voiced good understanding, and agrees to proceed. Will continue for a total of 10 years given lobular cancer if she tolerates well. -most recent mammogram on 08/31/21 was benign

## 2022-05-16 NOTE — Patient Instructions (Signed)

## 2022-05-16 NOTE — Progress Notes (Signed)
[facility]   [phone] [phone]   Clinic Follow up Note   Patient Care Team: Hadley Pen, MD as PCP - General (Family Medicine) Malachy Mood, MD as Consulting Physician (Hematology) Griselda Miner, MD as Consulting Physician (General Surgery) Pershing Proud, RN as Oncology Nurse Navigator Donnelly Angelica, RN as Oncology Nurse Navigator Antony Blackbird, MD as Consulting Physician (Radiation Oncology) Pollyann Samples, NP as Nurse Practitioner (Nurse Practitioner)  Date of Service:  05/16/2022  CHIEF COMPLAINT: f/u of left breast cancer   CURRENT THERAPY:   Antiestrogen therapy, started 10/2019, switched to tamoxifen 11/14/21   ASSESSMENT:  Sierra Lara is a 66 y.o. 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 S/p left lumpectomy with SLNB by Dr Carolynne Edouard on 11/19/19, path showed 1.6 and 1.2cm masses of invasive lobular carcinoma and LCIS was completely removed with clear margins, LN negative. -Oncotype RS of 20, low risk  -s/p adjuvant radiation with Dr Roselind Messier 1/10-02/21/20.  -she started Anastrozole neoadjuvantly in 10/2019. She developed worsening side effects and was switched to exemestane in 11/2020. She initially tolerated better but experienced increasing joint pain. I recommend switching to tamoxifen, which causes the least amount of joint pain. --She is clinically doing well, tolerating tamoxifen well, will continue -Lab and follow-up in 6 months   PLAN: -order Mammogram for 08/31/2022 -lab and f/u in 1 year -Continue Tamoxifen   SUMMARY OF ONCOLOGIC HISTORY: Oncology History Overview Note  Cancer Staging Malignant neoplasm of upper-inner quadrant of left breast in female, estrogen receptor positive (HCC) Staging form: Breast, AJCC 8th Edition - Clinical stage  from 09/22/2019: Stage IA (cT1a, cN0, cM0, G1, ER+, PR+, HER2-) - Signed by Malachy Mood, MD on 10/07/2019    Malignant neoplasm of upper-inner quadrant of left breast in female, estrogen receptor positive (HCC)  09/08/2019 Mammogram   Left Diagnostic Mammogram  IMPRESSION 1.Suspicious 5mm mass involving the UPPER OUTER QUADRANT of the LEFT breast at the 11:00 position approximately 6cmfn. This is the likely sonographic correlate for the screening mammographic architectural distortion.  2. No pathologic LEFT axillary lymphadnopathy.    09/13/2019 Initial Biopsy   Diagnosis  Breast, left, needle core biopsy, 11:00 position, 6cmfn -INVASIVE LOBULAR CARCINOMA, GRADE 2, AND LOBULAR CARCINOMA IN SITU -TUMOR INVOLVES ALL CORES AND MEASURES IN MAXIMUM EXTENT IN A SINGLE CORE.  -A BREAST PROGNOSTIC PROFILE WILL BE ORDERED ON BLOCK 1A AND SEPARATELY REPORTED -See comment    09/13/2019 Receptors her2   ER 90% positive, moderately staining intensity PR 80% positive, strong staining intensity  Proliferation Marker Ki67: 2%  HER2 Equivocal - HER2 Negative on [facility]   09/22/2019 Cancer Staging   Staging form: Breast, AJCC 8th Edition - Clinical stage from 09/22/2019: Stage IA (cT1a, cN0, cM0, G1, ER+, PR+, HER2-) - Signed by Malachy Mood, MD on 10/07/2019   09/22/2019 Initial Biopsy   Diagnosis 09/22/19 Breast, left, needle core biopsy, uiq - INVASIVE MAMMARY CARCINOMA, SEE COMMENT. - MAMMARY CARCINOMA IN SITU. Microscopic Comment The carcinoma appears grade 1 and measures 3 mm in greatest linear extent. E-cadherin will be ordered. Prognostic makers will be ordered. Dr. Kenard Gower has reviewed the case. The case was called to [facility] on 09/23/2019.   09/22/2019 Receptors her2   PROGNOSTIC INDICATORS Results: IMMUNOHISTOCHEMICAL AND MORPHOMETRIC ANALYSIS PERFORMED MANUALLY The tumor cells are  NEGATIVE for Her2 (1+). Estrogen Receptor: 95%, POSITIVE, STRONG STAINING  INTENSITY Progesterone Receptor: 2%, POSITIVE, STRONG STAINING INTENSITY Proliferation Marker Ki67: 15%    10/07/2019 Initial Diagnosis   Malignant neoplasm of upper-inner quadrant of left breast in female, estrogen receptor positive (HCC)   10/08/2019 Imaging   MRI Breast  IMPRESSION: 1. 1.5 cm biopsy-proven invasive mammary carcinoma and mammary carcinoma in situ in the posterior aspect of the upper inner quadrant of the left breast. 2. 0.8 cm biopsy-proven invasive lobular carcinoma and lobular carcinoma in situ in the upper inner quadrant of the left breast in the middle 3rd. 3. The 2 areas of malignancy in the upper inner left breast span 2.8 cm on the MR images with the clips located 3 cm apart on the post clip placement mammogram images. 4. No evidence of malignancy on the right. 5. No adenopathy.   10/2019 -  Anti-estrogen oral therapy   Anastrozole 1mg  once daily started in 10/2019. Started before her breast surgery.    11/16/2019 Genetic Testing   Negative genetic testing on the common hereditary cancer panel.  The Common Hereditary Gene Panel offered by [facility] includes sequencing and/or deletion duplication testing of the following 48 genes: APC, ATM, AXIN2, BARD1, BMPR1A, BRCA1, BRCA2, BRIP1, CDH1, CDK4, CDKN2A (p14ARF), CDKN2A (p16INK4a), CHEK2, CTNNA1, DICER1, EPCAM (Deletion/duplication testing only), GREM1 (promoter region deletion/duplication testing only), KIT, MEN1, MLH1, MSH2, MSH3, MSH6, MUTYH, NBN, NF1, NHTL1, PALB2, PDGFRA, PMS2, POLD1, POLE, PTEN, RAD50, RAD51C, RAD51D, RNF43, SDHB, SDHC, SDHD, SMAD4, SMARCA4. STK11, TP53, TSC1, TSC2, and VHL.  The following genes were evaluated for sequence changes only: SDHA and HOXB13 c.251G>A variant only.  The report date is November 16, 2019.   11/19/2019 Surgery   LEFT BREAST LUMPECTOMY WITH RADIOACTIVE SEED AND SENTINEL LYMPH NODE BIOPSY by Dr Carolynne Edouard    11/19/2019 Pathology Results   FINAL MICROSCOPIC DIAGNOSIS:   A. LYMPH  NODE, LEFT AXILLARY #1, SENTINEL, EXCISION:  - One lymph node with no metastatic carcinoma (0/1).   B. LYMPH NODE, LEFT AXILLARY #2, SENTINEL, EXCISION:  - One lymph node with no metastatic carcinoma (0/1).   C. LYMPH NODE, LEFT AXILLARY #3, SENTINEL, EXCISION:  - One lymph node with no metastatic carcinoma (0/1).   D. BREAST, LEFT, LUMPECTOMY:  - Invasive and in situ lobular carcinoma, 2 tumors, 1.6 and 1.2 cm.  - Invasive lobular carcinoma 0.2 cm from the posterior and superior  margins.  - Lobular carcinoma in situ 0.1 cm from superior margin.  - Biopsy sites and biopsy clips.  - See oncology table and comment.   E. BREAST, LEFT ADDITIONAL MEDIAL INFERIOR MARGIN, EXCISION:  - Focal lobular carcinoma in situ.  - No invasive carcinoma.  - Lobular carcinoma in situ focally less than 0.1 cm from final medial  margin.    11/19/2019 Oncotype testing    Oncotype  Recurrence Score 20  Distant Recurrence risk at 9 years of 6% There is less than 1% benefit of chemotherapy.    11/19/2019 Cancer Staging   Staging form: Breast, AJCC 8th Edition - Pathologic stage from 11/19/2019: Stage IA (pT1c, pN0, cM0, G1, ER+, PR+, HER2-, Oncotype DX score: 20) - Signed by Malachy Mood, MD on 02/11/2020 Stage prefix: Initial diagnosis Multigene prognostic tests performed: Oncotype DX Recurrence score range: Greater than or equal to 11 Histologic grading system: 3 grade system Residual tumor (R): R0 - None   01/24/2020 - 02/21/2020 Radiation Therapy   Adjuvant Radiation with Dr Roselind Messier   05/11/2020 Survivorship  SCP delivered by Santiago Glad, NP       INTERVAL HISTORY:  Sierra Lara is here for a follow up of left breast cancer. She was last seen by me on 11/14/2021. She presents to the clinic alone. Pt state that she is doing well. Pt state that she is doing well on the tamoxifen. Pt denied having any pain. Pt state that she is doing weight lifting foe exercising.  Pt state that she has issues with  Zometa       All other systems were reviewed with the patient and are negative.  MEDICAL HISTORY:  Past Medical History:  Diagnosis Date   Family history of breast cancer    Family history of colon cancer    Family history of prostate cancer    History of radiation therapy 01/24/2020-02/21/2020   Left Breast; Dr. Antony Blackbird   Hypertension    Personal history of radiation therapy    PONV (postoperative nausea and vomiting)    Skin cancer     SURGICAL HISTORY: Past Surgical History:  Procedure Laterality Date   BREAST BIOPSY Left 09/22/2019   BREAST LUMPECTOMY Left 11/19/2019   BREAST LUMPECTOMY WITH RADIOACTIVE SEED AND SENTINEL LYMPH NODE BIOPSY Left 11/19/2019   Procedure: LEFT BREAST LUMPECTOMY WITH RADIOACTIVE SEED AND SENTINEL LYMPH NODE BIOPSY;  Surgeon: Griselda Miner, MD;  Location: [facility];  Service: General;  Laterality: Left;   EYE SURGERY     ROTATOR CUFF REPAIR Right     I have reviewed the social history and family history with the patient and they are unchanged from previous note.  ALLERGIES:  has No Known Allergies.  MEDICATIONS:  Current Outpatient Medications  Medication Sig Dispense Refill   amLODipine (NORVASC) 2.5 MG tablet Take 2.5 mg by mouth daily.     Cholecalciferol (VITAMIN D3 PO) Take 25 mcg by mouth daily.     ezetimibe (ZETIA) 10 MG tablet Take 10 mg by mouth daily.     fluvastatin (LESCOL) 20 MG capsule Take 1 capsule by mouth every evening. (Patient not taking: Reported on 11/14/2020)     MAGNESIUM MALATE PO Take 2 capsules by mouth daily. (Patient not taking: Reported on 11/14/2020)     tamoxifen (NOLVADEX) 20 MG tablet Take 1 tablet (20 mg total) by mouth daily. 90 tablet 1   No current facility-administered medications for this visit.    PHYSICAL EXAMINATION: ECOG PERFORMANCE STATUS: 0 - Asymptomatic  Vitals:   05/16/22 1414  BP: 135/75  Pulse: 75  Resp: 15  Temp: 98.2 F (36.8 C)  SpO2: 99%   Wt  Readings from Last 3 Encounters:  05/16/22 167 lb 1.6 oz (75.8 kg)  11/14/21 164 lb 6 oz (74.6 kg)  05/14/21 160 lb 14.4 oz (73 kg)     GENERAL:alert, no distress and comfortable SKIN: skin color normal, no rashes or significant lesions EYES: normal, Conjunctiva are pink and non-injected, sclera clear  NEURO: alert & oriented x 3 with fluent speech ABDOMEN:(-) abdomen soft, (-) non-tender and (-) normal bowel sounds BREAST: RT breast , no palpable mass, LT breast lumpectomy nipple , no palpable mass breast exam benign. LABORATORY DATA:  I have reviewed the data as listed    Latest Ref Rng & Units 05/16/2022    1:26 PM 11/14/2021   10:34 AM 05/14/2021    9:59 AM  CBC  WBC 4.0 - 10.5 K/uL 6.0  5.1  5.4   Hemoglobin 12.0 - 15.0 g/dL 16.1  14.7  14.0   Hematocrit 36.0 - 46.0 % 40.3  43.6  41.2   Platelets 150 - 400 K/uL 179  194  195         Latest Ref Rng & Units 05/16/2022    1:26 PM 11/14/2021   10:34 AM 05/14/2021    9:59 AM  CMP  Glucose 70 - 99 mg/dL 161  90  93   BUN 8 - 23 mg/dL 15  15  11    Creatinine 0.44 - 1.00 mg/dL 0.96  0.45  4.09   Sodium 135 - 145 mmol/L 139  138  138   Potassium 3.5 - 5.1 mmol/L 4.0  4.0  4.1   Chloride 98 - 111 mmol/L 106  105  104   CO2 22 - 32 mmol/L 26  28  29    Calcium 8.9 - 10.3 mg/dL 9.1  8.9  9.1   Total Protein 6.5 - 8.1 g/dL 6.6  7.1  6.9   Total Bilirubin 0.3 - 1.2 mg/dL 0.3  0.5  0.5   Alkaline Phos 38 - 126 U/L 50  53  65   AST 15 - 41 U/L 22  24  25    ALT 0 - 44 U/L 25  30  39       RADIOGRAPHIC STUDIES: I have personally reviewed the radiological images as listed and agreed with the findings in the report. No results found.    Orders Placed This Encounter  Procedures   MM Digital Screening    Standing Status:   Future    Standing Expiration Date:   05/16/2023    Order Specific Question:   Reason for Exam (SYMPTOM  OR DIAGNOSIS REQUIRED)    Answer:   routine screen    Order Specific Question:   Preferred imaging location?     Answer:   [facility]    Order Specific Question:   Release to patient    Answer:   Immediate   VITAMIN D 25 Hydroxy (Vit-D Deficiency, Fractures)    Standing Status:   Standing    Number of Occurrences:   30    Standing Expiration Date:   05/16/2023   All questions were answered. The patient knows to call the clinic with any problems, questions or concerns. No barriers to learning was detected. The total time spent in the appointment was 25 minutes.     Malachy Mood, MD 05/16/2022   Carolin Coy, CMA, am acting as scribe for Malachy Mood, MD.   I have reviewed the above documentation for accuracy and completeness, and I agree with the above.

## 2022-07-24 ENCOUNTER — Telehealth: Payer: Self-pay | Admitting: Internal Medicine

## 2022-07-24 NOTE — Telephone Encounter (Signed)
Hi Dr. Leonides Schanz,  Supervising Provider: 07/24/22-AM  We received a referral for patient to have a colonoscopy done. She doe shave GI history from [place] last colonoscopy done in 2019 with Dr. Charm Barges. Her records were obtained and scanned into Media for you to review.    Thanks

## 2022-07-25 NOTE — Telephone Encounter (Signed)
Okay to schedule for direct colonoscopy at [facility] for history of colon polyps.  Colonoscopy 09/18/07: 8 mm polyp at the hepatic flexure s/p resection with hot snare. Good prep. Path: TA  Colonoscopy 06/30/17: 6 mm cecal sessile polyp s/p cold snare polypectomy. Good prep. Repeat colonoscopy recommended in 5 years. Path: Tubular adenoma

## 2022-07-30 ENCOUNTER — Telehealth: Payer: Self-pay

## 2022-07-30 NOTE — Telephone Encounter (Addendum)
John,   This patients chart has not been reviewed looks like she may be marked as a diff intubation. Pt PV is tomorrow please verify before we notify the patient of the cancellation    Thank you previsit

## 2022-07-31 ENCOUNTER — Other Ambulatory Visit: Payer: Self-pay

## 2022-07-31 ENCOUNTER — Ambulatory Visit (AMBULATORY_SURGERY_CENTER): Payer: 59

## 2022-07-31 VITALS — Ht 65.0 in | Wt 165.0 lb

## 2022-07-31 DIAGNOSIS — Z86010 Personal history of colonic polyps: Secondary | ICD-10-CM

## 2022-07-31 MED ORDER — NA SULFATE-K SULFATE-MG SULF 17.5-3.13-1.6 GM/177ML PO SOLN
1.0000 | Freq: Once | ORAL | 0 refills | Status: AC
Start: 2022-07-31 — End: 2022-07-31

## 2022-07-31 NOTE — Telephone Encounter (Signed)
Printed and noted on chart for PV

## 2022-07-31 NOTE — Progress Notes (Signed)
 Denies allergies to eggs or soy products. Denies complication of anesthesia or sedation. Denies use of weight loss medication. Denies use of O2.   Emmi instructions given for colonoscopy.  

## 2022-08-13 ENCOUNTER — Encounter: Payer: Self-pay | Admitting: Internal Medicine

## 2022-08-20 ENCOUNTER — Encounter: Payer: Self-pay | Admitting: Certified Registered Nurse Anesthetist

## 2022-08-21 ENCOUNTER — Telehealth: Payer: Self-pay | Admitting: Internal Medicine

## 2022-08-21 NOTE — Telephone Encounter (Signed)
Attempted to return pt's call. No answer. Left message. Will try again later.

## 2022-08-21 NOTE — Telephone Encounter (Signed)
Returned pt's call. She was concerned about the weather and travel conditions tomorrow. Informed pt that as of now we have not made any decision to close tomorrow, but that if we did make that decision we would contact pt's before the time to start the prep this evening. Instructed pt that it is up to her personal preference on concerns for travel tomorrow. Pt wants to keep the procedure time. She will call back if she changes her mind later today.

## 2022-08-21 NOTE — Telephone Encounter (Signed)
Inbound call from patient needing to be further advised on prep for upcoming procedure.

## 2022-08-22 ENCOUNTER — Ambulatory Visit (AMBULATORY_SURGERY_CENTER): Payer: 59 | Admitting: Internal Medicine

## 2022-08-22 ENCOUNTER — Encounter: Payer: Self-pay | Admitting: Internal Medicine

## 2022-08-22 VITALS — BP 92/69 | HR 64 | Temp 97.3°F | Resp 16 | Ht 65.0 in | Wt 165.0 lb

## 2022-08-22 DIAGNOSIS — Z09 Encounter for follow-up examination after completed treatment for conditions other than malignant neoplasm: Secondary | ICD-10-CM

## 2022-08-22 DIAGNOSIS — Z86010 Personal history of colonic polyps: Secondary | ICD-10-CM | POA: Diagnosis not present

## 2022-08-22 MED ORDER — SODIUM CHLORIDE 0.9 % IV SOLN: 500.0000 mL | Freq: Once | INTRAVENOUS | Status: AC

## 2022-08-22 NOTE — Progress Notes (Signed)
GASTROENTEROLOGY PROCEDURE H&P NOTE   Primary Care Physician: Hadley Pen, MD    Reason for Procedure:   History of colon polyps  Plan:    Colonoscopy   Patient is appropriate for endoscopic procedure(s) in the ambulatory ([facility]) setting.  The nature of the procedure, as well as the risks, benefits, and alternatives were carefully and thoroughly reviewed with the patient. Ample time for discussion and questions allowed. The patient understood, was satisfied, and agreed to proceed.     HPI: Sierra Lara is a 67 y.o. female who presents for colonoscopy for history of colon polyps. Denies blood in stools, changes in bowel habits, or unintentional weight loss. She is suspicious that her mother may have had undiagnosed colon cancer (she had a large bloody BM near the end of her life and never had a colonoscopy).  Colonoscopy 09/18/07: 8 mm polyp at the hepatic flexure s/p resection with hot snare. Good prep. Path: TA   Colonoscopy 06/30/17: 6 mm cecal sessile polyp s/p cold snare polypectomy. Good prep. Repeat colonoscopy recommended in 5 years. Path: Tubular adenoma  Past Medical History:  Diagnosis Date   Chronic kidney disease    Family history of breast cancer    Family history of colon cancer    Family history of prostate cancer    History of radiation therapy 01/24/2020-02/21/2020   Left Breast; Dr. Antony Blackbird   Hyperlipidemia    Hypertension    Osteopenia    Personal history of radiation therapy    PONV (postoperative nausea and vomiting)    Skin cancer     Past Surgical History:  Procedure Laterality Date   BREAST BIOPSY Left 09/22/2019   BREAST LUMPECTOMY Left 11/19/2019   BREAST LUMPECTOMY WITH RADIOACTIVE SEED AND SENTINEL LYMPH NODE BIOPSY Left 11/19/2019   Procedure: LEFT BREAST LUMPECTOMY WITH RADIOACTIVE SEED AND SENTINEL LYMPH NODE BIOPSY;  Surgeon: Griselda Miner, MD;  Location: [facility];  Service: General;  Laterality: Left;   EYE  SURGERY     ROTATOR CUFF REPAIR Right     Prior to Admission medications   Medication Sig Start Date End Date Taking? Authorizing Provider  amLODipine (NORVASC) 2.5 MG tablet Take 2.5 mg by mouth daily.   Yes [provider]  Bempedoic Acid (NEXLETOL) 180 MG TABS Take 180 mg by mouth daily.   Yes [provider]  Cholecalciferol (VITAMIN D3 PO) Take 25 mcg by mouth daily.   Yes [provider]  ezetimibe (ZETIA) 10 MG tablet Take 10 mg by mouth daily.   Yes [provider]  MAGNESIUM MALATE PO Take 2 capsules by mouth daily.   Yes [provider]  OVER THE COUNTER MEDICATION Krill Oil , one gel capsule daily.   Yes [provider]  tamoxifen (NOLVADEX) 20 MG tablet Take 1 tablet (20 mg total) by mouth daily. 02/11/22  Yes Malachy Mood, MD  ibuprofen (ADVIL) 200 MG tablet Take 200 mg by mouth every 6 (six) hours as needed.    [provider]    Current Outpatient Medications  Medication Sig Dispense Refill   amLODipine (NORVASC) 2.5 MG tablet Take 2.5 mg by mouth daily.     Bempedoic Acid (NEXLETOL) 180 MG TABS Take 180 mg by mouth daily.     Cholecalciferol (VITAMIN D3 PO) Take 25 mcg by mouth daily.     ezetimibe (ZETIA) 10 MG tablet Take 10 mg by mouth daily.     MAGNESIUM MALATE PO Take 2  capsules by mouth daily.     OVER THE COUNTER MEDICATION Krill Oil , one gel capsule daily.     tamoxifen (NOLVADEX) 20 MG tablet Take 1 tablet (20 mg total) by mouth daily. 90 tablet 1   ibuprofen (ADVIL) 200 MG tablet Take 200 mg by mouth every 6 (six) hours as needed.     Current Facility-Administered Medications  Medication Dose Route Frequency Provider Last Rate Last Admin   0.9 %  sodium chloride infusion  500 mL Intravenous Once Imogene Burn, MD        Allergies as of 08/22/2022   (No Known Allergies)    Family History  Problem Relation Age of Onset   Colon cancer Mother 35   Prostate cancer Father 54   Prostate cancer  Brother 85   Breast cancer Maternal Aunt 60   Lung cancer Maternal Uncle        smoker   Breast cancer Maternal Grandmother        dx in her 37s   Esophageal cancer Neg Hx    Rectal cancer Neg Hx    Stomach cancer Neg Hx     Social History   Socioeconomic History   Marital status: Married    Spouse name: Not on file   Number of children: 3   Years of education: Not on file   Highest education level: Not on file  Occupational History   Occupation: nurse   Tobacco Use   Smoking status: Never   Smokeless tobacco: Never  Vaping Use   Vaping status: Never Used  Substance and Sexual Activity   Alcohol use: Yes    Comment: social drinker    Drug use: Never   Sexual activity: Not Currently  Other Topics Concern   Not on file  Social History Narrative   Not on file   Social Determinants of Health   Financial Resource Strain: Low Risk  (05/30/2021)   [facility] prior to 03/16/2022., [facility]   Overall Financial Resource Strain (CARDIA)    Difficulty of Paying Living Expenses: Not hard at all  Food Insecurity: No Food Insecurity (05/30/2021)   [facility] to [facility]/16/2022., [facility] to [facility]16/[facility]22[facility]   Hunger Vital Sign    Worried About Running Out of Food in the Last Year: Never true    Ran Out of Food in the Last Year: Never true  Transportation Needs: No Transportation Needs (05/30/2021)   [facility] to [facility]/16/2022., [facility] to [facility]16/[facility]   PRAPARE - Transportation    Lack of Transportation (Medical): No    Lack of Transportation (Non-Medical): No  Physical Activity: Unknown (05/30/2021)   [facility]its prior to 03/16/2022., [facility] visits prior to [facility]/16/2022[facility]   Exercise Vital Sign    Days of Exercise per Week: 0 days    Minutes of Exercise per Session: Not on file  Stress: No Stress Concern Present (05/30/2021)   [facility] prior to 03/16/2022., [facility] visits prior to 03/16/2022[facility]   [employer] of Occupational Health - Occupational Stress Questionnaire  Feeling of Stress : Only a little  Social Connections: Socially Integrated (05/30/2021)   [facility] prior to 03/16/2022., [facility] visits prior to [facility]/16/2022[facility]   Social Connection and Isolation Panel [NHANES]    Frequency of Communication with Friends and Family: More than three times a week    Frequency of Social Gatherings with Friends and Family: More than three times a week    Attends Religious Services: More than 4 times per year    Active Member of [employer] or Organizations: Yes    Attends Banker Meetings: More than 4 times per year    Marital Status: Married  Catering manager Violence: Not At Risk (05/30/2021)   [facility] prior to 03/16/2022., [facility] visits prior to 03/16/2022.   Humiliation, Afraid, Rape, and Kick questionnaire    Fear of Current or Ex-Partner: No    Emotionally Abused: No    Physically Abused: No    Sexually Abused: No    Physical Exam: Vital signs in last 24 hours: BP 137/76   Pulse 81   Temp (!) 97.3 F (36.3 C) (Temporal)   Ht 5\' 5"  (1.651 m)   Wt 165 lb (74.8 kg)   SpO2 96%   BMI 27.46 kg/m  GEN: NAD EYE: Sclerae anicteric ENT: MMM CV: Non-tachycardic Pulm: No increased work of breathing GI: Soft, NT/ND NEURO:  Alert & Oriented   Eulah Pont, MD [place]  [facility]  08/22/2022 2:15 PM

## 2022-08-22 NOTE — Progress Notes (Signed)
Pt's states no medical or surgical changes since previsit or office visit. 

## 2022-08-22 NOTE — Patient Instructions (Signed)
YOU HAD AN ENDOSCOPIC PROCEDURE TODAY AT THE [facility]:   Refer to the procedure report that was given to you for any specific questions about what was found during the examination.  If the procedure report does not answer your questions, please call your gastroenterologist to clarify.  If you requested that your care partner not be given the details of your procedure findings, then the procedure report has been included in a sealed envelope for you to review at your convenience later.  YOU SHOULD EXPECT: Some feelings of bloating in the abdomen. Passage of more gas than usual.  Walking can help get rid of the air that was put into your GI tract during the procedure and reduce the bloating. If you had a lower endoscopy (such as a colonoscopy or flexible sigmoidoscopy) you may notice spotting of blood in your stool or on the toilet paper. If you underwent a bowel prep for your procedure, you may not have a normal bowel movement for a few days.  Please Note:  You might notice some irritation and congestion in your nose or some drainage.  This is from the oxygen used during your procedure.  There is no need for concern and it should clear up in a day or so.  SYMPTOMS TO REPORT IMMEDIATELY:  Following lower endoscopy (colonoscopy or flexible sigmoidoscopy):  Excessive amounts of blood in the stool  Significant tenderness or worsening of abdominal pains  Swelling of the abdomen that is new, acute  Fever of 100F or higher   For urgent or emergent issues, a gastroenterologist can be reached at any hour by calling [phone]. Do not use MyChart messaging for urgent concerns.    DIET:  We do recommend a small meal at first, but then you may proceed to your regular diet.  Drink plenty of fluids but you should avoid alcoholic beverages for 24 hours.  MEDICATIONS: Continue present medications.  FOLLOW UP: Repeat colonoscopy in 10 years for screening purposes.  Please see handouts  given to you by your recovery nurse: Diverticulosis, Hemorrhoids.  Thank you for allowing [place] to provide for your healthcare needs today.  ACTIVITY:  You should plan to take it easy for the rest of today and you should NOT DRIVE or use heavy machinery until tomorrow (because of the sedation medicines used during the test).    FOLLOW UP: Our staff will call the number listed on your records the next business day following your procedure.  We will call around 7:15- 8:00 am to check on you and address any questions or concerns that you may have regarding the information given to you following your procedure. If we do not reach you, we will leave a message.     If any biopsies were taken you will be contacted by phone or by letter within the next 1-3 weeks.  Please call us at [phone] if you have not heard about the biopsies in 3 weeks.    SIGNATURES/CONFIDENTIALITY: You and/or your care partner have signed paperwork which will be entered into your electronic medical record.  These signatures attest to the fact that that the information above on your After Visit Summary has been reviewed and is understood.  Full responsibility of the confidentiality of this discharge information lies with you and/or your care-partner.

## 2022-08-22 NOTE — Progress Notes (Signed)
Report given to PACU, vss 

## 2022-08-22 NOTE — Op Note (Signed)
[facility] Patient Name: Sierra Lara Procedure Date: 08/22/2022 2:12 PM MRN: [id number] Endoscopist: Madelyn Brunner Haskell , , [id number] Age: 67 Referring MD:  Date of Birth: 10-02-1955 Gender: Female Account #: [id number] Procedure:                Colonoscopy Indications:              High risk colon cancer surveillance: Personal                            history of colonic polyps Medicines:                Monitored Anesthesia Care Procedure:                Pre-Anesthesia Assessment:                           - Prior to the procedure, a History and Physical                            was performed, and patient medications and                            allergies were reviewed. The patient's tolerance of                            previous anesthesia was also reviewed. The risks                            and benefits of the procedure and the sedation                            options and risks were discussed with the patient.                            All questions were answered, and informed consent                            was obtained. Prior Anticoagulants: The patient has                            taken no anticoagulant or antiplatelet agents. ASA                            Grade Assessment: II - A patient with mild systemic                            disease. After reviewing the risks and benefits,                            the patient was deemed in satisfactory condition to                            undergo the procedure.  After obtaining informed consent, the colonoscope                            was passed under direct vision. Throughout the                            procedure, the patient's blood pressure, pulse, and                            oxygen saturations were monitored continuously. The                            CF [id number] #[id number] was introduced through the anus                            and advanced to the the terminal  ileum. The                            colonoscopy was performed without difficulty. The                            patient tolerated the procedure well. The quality                            of the bowel preparation was good. The terminal                            ileum, ileocecal valve, appendiceal orifice, and                            rectum were photographed. Scope In: 2:26:19 PM Scope Out: 2:38:11 PM Scope Withdrawal Time: 0 hours 8 minutes 53 seconds  Total Procedure Duration: 0 hours 11 minutes 52 seconds  Findings:                 The terminal ileum appeared normal.                           Multiple diverticula were found in the sigmoid                            colon.                           Non-bleeding internal hemorrhoids were found during                            retroflexion. Complications:            No immediate complications. Estimated Blood Loss:     Estimated blood loss: none. Impression:               - The examined portion of the ileum was normal.                           - Diverticulosis in the sigmoid colon.                           -  Non-bleeding internal hemorrhoids.                           - No specimens collected. Recommendation:           - Discharge patient to home (with escort).                           - Repeat colonoscopy in 10 years for screening                            purposes.                           - The findings and recommendations were discussed                            with the patient. Dr Particia Lather "Alan Ripper" Leonides Schanz,  08/22/2022 2:43:33 PM

## 2022-08-23 ENCOUNTER — Telehealth: Payer: Self-pay | Admitting: *Deleted

## 2022-08-23 NOTE — Telephone Encounter (Signed)
  Follow up Call-     08/22/2022    1:50 PM  Call back number  Post procedure Call Back phone  # [phone]  Permission to leave phone message Yes    Post procedure follow up phone call. No answer at number given.  Left message on voicemail.

## 2022-09-02 ENCOUNTER — Ambulatory Visit
Admission: RE | Admit: 2022-09-02 | Discharge: 2022-09-02 | Disposition: A | Discharge status: Home / Self Care | Payer: 59 | Source: Ambulatory Visit | Attending: Hematology | Admitting: Hematology

## 2022-09-02 DIAGNOSIS — C50212 Malignant neoplasm of upper-inner quadrant of left female breast: Secondary | ICD-10-CM

## 2022-09-02 HISTORY — DX: Malignant neoplasm of unspecified site of unspecified female breast: C50.919

## 2022-09-19 ENCOUNTER — Other Ambulatory Visit: Payer: Self-pay | Admitting: Hematology

## 2022-10-29 ENCOUNTER — Other Ambulatory Visit: Payer: Self-pay | Admitting: Hematology

## 2023-03-20 ENCOUNTER — Other Ambulatory Visit: Payer: Self-pay | Admitting: Hematology

## 2023-06-18 ENCOUNTER — Other Ambulatory Visit: Payer: Self-pay | Admitting: Hematology

## 2023-07-28 ENCOUNTER — Other Ambulatory Visit: Payer: Self-pay | Admitting: Hematology

## 2023-07-28 DIAGNOSIS — Z1231 Encounter for screening mammogram for malignant neoplasm of breast: Secondary | ICD-10-CM

## 2023-08-01 ENCOUNTER — Other Ambulatory Visit: Payer: Self-pay | Admitting: Medical Genetics

## 2023-08-05 ENCOUNTER — Other Ambulatory Visit (HOSPITAL_COMMUNITY)
Admission: RE | Admit: 2023-08-05 | Discharge: 2023-08-05 | Disposition: A | Discharge status: Home / Self Care | Payer: Self-pay | Source: Ambulatory Visit | Attending: Medical Genetics | Admitting: Medical Genetics

## 2023-08-15 LAB — GENECONNECT MOLECULAR SCREEN: Genetic Analysis Overall Interpretation: NEGATIVE

## 2023-09-03 ENCOUNTER — Ambulatory Visit

## 2023-09-04 ENCOUNTER — Ambulatory Visit
Admission: RE | Admit: 2023-09-04 | Discharge: 2023-09-04 | Disposition: A | Discharge status: Home / Self Care | Source: Ambulatory Visit | Attending: Hematology | Admitting: Hematology

## 2023-09-04 DIAGNOSIS — Z1231 Encounter for screening mammogram for malignant neoplasm of breast: Secondary | ICD-10-CM

## 2023-09-08 ENCOUNTER — Other Ambulatory Visit (HOSPITAL_BASED_OUTPATIENT_CLINIC_OR_DEPARTMENT_OTHER): Payer: Self-pay | Admitting: Family Medicine

## 2023-09-08 DIAGNOSIS — E782 Mixed hyperlipidemia: Secondary | ICD-10-CM

## 2023-09-16 ENCOUNTER — Other Ambulatory Visit: Payer: Self-pay | Admitting: Hematology

## 2023-09-17 ENCOUNTER — Ambulatory Visit (INDEPENDENT_AMBULATORY_CARE_PROVIDER_SITE_OTHER)
Admission: RE | Admit: 2023-09-17 | Discharge: 2023-09-17 | Disposition: A | Discharge status: Home / Self Care | Payer: Self-pay | Source: Ambulatory Visit | Attending: Family Medicine | Admitting: Family Medicine

## 2023-09-17 ENCOUNTER — Telehealth: Payer: Self-pay | Admitting: Nurse Practitioner

## 2023-09-17 DIAGNOSIS — E782 Mixed hyperlipidemia: Secondary | ICD-10-CM

## 2023-09-17 NOTE — Telephone Encounter (Signed)
 Pt needs a f/u appt with the provider before refilling the Tamoxifen .

## 2023-09-18 ENCOUNTER — Other Ambulatory Visit: Payer: Self-pay

## 2023-09-18 ENCOUNTER — Ambulatory Visit (HOSPITAL_BASED_OUTPATIENT_CLINIC_OR_DEPARTMENT_OTHER): Attending: Pain Medicine | Admitting: Physical Therapy

## 2023-09-18 ENCOUNTER — Encounter (HOSPITAL_BASED_OUTPATIENT_CLINIC_OR_DEPARTMENT_OTHER): Payer: Self-pay | Admitting: Physical Therapy

## 2023-09-18 DIAGNOSIS — M542 Cervicalgia: Secondary | ICD-10-CM | POA: Insufficient documentation

## 2023-09-18 NOTE — Therapy (Signed)
 OUTPATIENT PHYSICAL THERAPY CERVICAL EVALUATION   Patient Name: Sierra Lara MRN: [id number] DOB:1955/05/07, 68 y.o., female Today's Date: 09/18/2023  END OF SESSION:  PT End of Session - 09/18/23 0811     Visit Number 1    Number of Visits 16    Date for PT Re-Evaluation 11/13/23    PT Start Time 0800    PT Stop Time 0843    PT Time Calculation (min) 43 min    Activity Tolerance Patient tolerated treatment well    Behavior During Therapy [facility] for tasks assessed/performed          Past Medical History:  Diagnosis Date   Breast cancer (HCC)    Chronic kidney disease    Family history of breast cancer    Family history of colon cancer    Family history of prostate cancer    History of radiation therapy 01/24/2020-02/21/2020   Left Breast; Dr. Lynwood Nasuti   Hyperlipidemia    Hypertension    Osteopenia    Personal history of radiation therapy    PONV (postoperative nausea and vomiting)    Skin cancer    Past Surgical History:  Procedure Laterality Date   BREAST BIOPSY Left 09/22/2019   BREAST LUMPECTOMY Left 11/19/2019   BREAST LUMPECTOMY WITH RADIOACTIVE SEED AND SENTINEL LYMPH NODE BIOPSY Left 11/19/2019   Procedure: LEFT BREAST LUMPECTOMY WITH RADIOACTIVE SEED AND SENTINEL LYMPH NODE BIOPSY;  Surgeon: [name], MD;  Location: [facility];  Service: General;  Laterality: Left;   EYE SURGERY     ROTATOR CUFF REPAIR Right    Patient Active Problem List   Diagnosis Date Noted   Hypercholesteremia 12/29/2019   Genetic testing 11/18/2019   Family history of breast cancer    Family history of prostate cancer    Family history of colon cancer    Malignant neoplasm of upper-inner quadrant of left breast in female, estrogen receptor positive (HCC) 10/07/2019   Essential hypertension 04/28/2017   Mixed dyslipidemia 04/28/2017   Nephrolithiasis 04/28/2017   Osteopenia 04/28/2017    PCP: Lamar Simpers MD   REFERRING PROVIDER: Shelly Charity MD    REFERRING DIAG:  Diagnosis  M79.18,G89.29 (ICD-10-CM) - Chronic myofascial pain  M54.12 (ICD-10-CM) - Radiculopathy, cervical region    THERAPY DIAG:  Cervicalgia  Rationale for Evaluation and Treatment: Rehabilitation  ONSET DATE: June 11th   SUBJECTIVE:  SUBJECTIVE STATEMENT: The patient had an acute onset of right-sided cervical pain that radiated into her upper arm starting approximately 4 months ago.  She fell asleep with her head tilted to the left and woke up with cervical pain.  She trialed dry needling and physical therapy exercises with no significant benefit.  She recently began regimen of gabapentin  which has resulted in some improvement in pain.  She continues to have some radicular pain down the entire arm.  She mostly feels it with her arms hanging down when she is walking. Hand dominance: Right  PERTINENT HISTORY:  Osteopenia, breast cancer on the left  PAIN:  Are you having pain? Yes: NPRS scale: 0/10 right 1/10 over the past 5 days  Pain location: right upper trap  Pain description: aching  Aggravating factors: sleeping, and walking   Relieving factors: putting her arm over the head   PRECAUTIONS: None  RED FLAGS: None     WEIGHT BEARING RESTRICTIONS: No  FALLS:  Has patient fallen in last 6 months? No  LIVING ENVIRONMENT: Nothing   OCCUPATION:  Retired   Recreation:  Production assistant, radio   PLOF: Independent  PATIENT GOALS:  To have less pain   NEXT MD VISIT:  End of September   OBJECTIVE:  Note: Objective measures were completed at Evaluation unless otherwise noted.  DIAGNOSTIC FINDINGS:  X-ray:     PATIENT SURVEYS:  NDI 26%  Minimum Detectable Change (90% confidence): 5 points or 10% points  COGNITION: Overall  cognitive status: Within functional limits for tasks assessed  SENSATION: Pan ges down to mid upper arm   POSTURE: forward head  PALPATION:    CERVICAL ROM:   Active ROM A/PROM (deg) eval  Flexion 40   Extension 40   Right lateral flexion   Left lateral flexion   Right rotation 70 degrees   Left rotation 70 degrees   (Blank rows = not tested)  UPPER EXTREMITY ROM:  Active ROM Right eval Left eval  Shoulder flexion Full Full  Shoulder extension    Shoulder abduction    Shoulder adduction    Shoulder extension    Shoulder internal rotation Within normal limits Within normal limits  Shoulder external rotation Within normal limits Within normal limits  Elbow flexion    Elbow extension    Wrist flexion    Wrist extension    Wrist ulnar deviation    Wrist radial deviation    Wrist pronation    Wrist supination     (Blank rows = not tested)  UPPER EXTREMITY MMT:  MMT Right eval Left eval  Shoulder flexion 5 5  Shoulder extension    Shoulder abduction    Shoulder adduction    Shoulder extension    Shoulder internal rotation 5 5   Shoulder external rotation 5 5  Middle trapezius    Lower trapezius    Elbow flexion    Elbow extension    Wrist flexion    Wrist extension    Wrist ulnar deviation    Wrist radial deviation    Wrist pronation    Wrist supination    Grip strength 42 40   (Blank rows = not tested)   TREATMENT DATE:         Manual review of trigger points   There-ex:  Levator stretch for right 3x20 sec hold  Upper trap stretch 3x20 sec hold   Low row red 3x10  Shoulde extension red 3x10  PATIENT EDUCATION:  Education details: HEP, symptom management, importance of posture with neck pain Person educated: Patient Education method: Explanation, Demonstration, Tactile cues, Verbal cues, and Handouts Education  comprehension: verbalized understanding, returned demonstration, verbal cues required, tactile cues required, and needs further education  HOME EXERCISE PROGRAM: Access Code: [email or web address] URL: [email or web address] Date: 09/18/2023 Prepared by: Alm Don  Exercises - Gentle Levator Scapulae Stretch  - 1 x daily - 7 x weekly - 3 sets - 3 reps - 20sec  hold - Seated Upper Trapezius Stretch  - 1 x daily - 7 x weekly - 3 sets - 3 reps - 20 sec  hold - Scapular Retraction with Resistance  - 1 x daily - 7 x weekly - 3 sets - 10 reps - Shoulder extension with resistance - Neutral  - 1 x daily - 7 x weekly - 3 sets - 10 reps  ASSESSMENT:  CLINICAL IMPRESSION: Patient is a 68 year old female who presents with acute onset of cervical radiculopathy.  She had significant improvement in symptoms with gabapentin  but still has residual symptoms going to her upper arm.  She has increased pain when she walks.  Pain is relieved when she puts her arm overhead.  She has mild spasming in her upper trap and around her C4-C5 area cervical paraspinals.  Manage reviewed base stretching and soft tissue mobilization program.  We also reviewed a posterior chain strengthening program for her to begin.  She is advised to begin below her RPE and progress per symptoms.  Benefits from skilled therapy to reduce radicular symptoms and progress her back into a full gym program. OBJECTIVE IMPAIRMENTS: decreased knowledge of condition, decreased ROM, increased muscle spasms, and pain.   ACTIVITY LIMITATIONS: carrying, standing, and locomotion level  PARTICIPATION LIMITATIONS: shopping, community activity, and walking for exercise  PERSONAL FACTORS: 1-2 comorbidities: Osteopenia are also affecting patient's functional outcome.   REHAB POTENTIAL: Good  CLINICAL DECISION MAKING: Stable/uncomplicated  EVALUATION COMPLEXITY: Low   GOALS: Goals reviewed with patient? No  SHORT TERM GOALS: Target date:  10/16/2023    Patient will increase bilateral cervical rotation by 10 degrees Baseline:  Goal status: INITIAL  2.  Patient will report 75% reduction in radicular symptoms and right arm Baseline:  Goal status: INITIAL  3.  Patient will be independent with basic HEP Baseline:  Goal status: INITIAL  LONG TERM GOALS: Target date: 11/13/2023    Patient will ambulate for exercise without pain into her arm Baseline:  Goal status: INITIAL  2.  Patient will return to full gym program Baseline:  Goal status: INITIAL  Goal status: INITIAL   PLAN:  PT FREQUENCY: 2x/week  PT DURATION: 8 weeks  PLANNED INTERVENTIONS: 97110-Therapeutic exercises, 97530- Therapeutic activity, [id number]- Neuromuscular re-education, 97535- Self Care, [id number]- Manual therapy, [id number]- Gait training, [id number]- Aquatic Therapy, 97014- Electrical stimulation (unattended), 97035- Ultrasound, Patient/Family education, Stair training, Taping, Dry Needling, DME instructions, Cryotherapy, and Moist heat   PLAN FOR NEXT SESSION: Continue posterior chain strengthening .  Continue with soft tissue mobilization if beneficial.  Consider manual traction if radicular symptoms persist.  Review current HEP.  If symptoms return consider trigger point dry needling.   Alm JINNY Don, PT 09/18/2023, 7:14 PM

## 2023-09-19 ENCOUNTER — Other Ambulatory Visit: Payer: Self-pay

## 2023-09-19 DIAGNOSIS — Z170 Estrogen receptor positive status [ER+]: Secondary | ICD-10-CM

## 2023-09-21 NOTE — Progress Notes (Unsigned)
 [facility]   [phone] [phone]    Patient Care Team: Sierra Lamar LABOR, MD as PCP - General (Family Medicine) Sierra Callander, MD as Consulting Physician (Hematology) Sierra Deward MOULD, MD as Consulting Physician (General Surgery) Sierra Stephane BROCKS, RN (Inactive) as Oncology Nurse Navigator Sierra Nanetta SAILOR, RN as Oncology Nurse Navigator Sierra Agent, MD as Consulting Physician (Radiation Oncology) Sierra Duey K, NP as Nurse Practitioner (Nurse Practitioner)   CHIEF COMPLAINT: Follow up left breast cancer   Oncology History Overview Note  Cancer Staging Malignant neoplasm of upper-inner quadrant of left breast in female, estrogen receptor positive (HCC) Staging form: Breast, AJCC 8th Edition - Clinical stage from 09/22/2019: Stage IA (cT1a, cN0, cM0, G1, ER+, PR+, HER2-) - Signed by Sierra Callander, MD on 10/07/2019    Malignant neoplasm of upper-inner quadrant of left breast in female, estrogen receptor positive (HCC)  09/08/2019 Mammogram   Left Diagnostic Mammogram  IMPRESSION 1.Suspicious 5mm mass involving the UPPER OUTER QUADRANT of the LEFT breast at the 11:00 position approximately 6cmfn. This is the likely sonographic correlate for the screening mammographic architectural distortion.  2. No pathologic LEFT axillary lymphadnopathy.    09/13/2019 Initial Biopsy   Diagnosis  Breast, left, needle core biopsy, 11:00 position, 6cmfn -INVASIVE LOBULAR CARCINOMA, GRADE 2, AND LOBULAR CARCINOMA IN SITU -TUMOR INVOLVES ALL CORES AND MEASURES IN MAXIMUM EXTENT IN A SINGLE CORE.  -A BREAST PROGNOSTIC PROFILE WILL BE ORDERED ON BLOCK 1A AND SEPARATELY REPORTED -See comment    09/13/2019 Receptors her2   ER 90% positive, moderately staining intensity PR 80% positive, strong staining intensity  Proliferation Marker Ki67: 2%  HER2 Equivocal - HER2 Negative on FISH   09/22/2019 Cancer Staging   Staging form: Breast, AJCC 8th Edition - Clinical  stage from 09/22/2019: Stage IA (cT1a, cN0, cM0, G1, ER+, PR+, HER2-) - Signed by Sierra Callander, MD on 10/07/2019   09/22/2019 Initial Biopsy   Diagnosis 09/22/19 Breast, left, needle core biopsy, uiq - INVASIVE MAMMARY CARCINOMA, SEE COMMENT. - MAMMARY CARCINOMA IN SITU. Microscopic Comment The carcinoma appears grade 1 and measures 3 mm in greatest linear extent. E-cadherin will be ordered. Prognostic makers will be ordered. Dr. Rebbecca has reviewed the case. The case was called to [facility] on 09/23/2019.   09/22/2019 Receptors her2   PROGNOSTIC INDICATORS Results: IMMUNOHISTOCHEMICAL AND MORPHOMETRIC ANALYSIS PERFORMED MANUALLY The tumor cells are NEGATIVE for Her2 (1+). Estrogen Receptor: 95%, POSITIVE, STRONG STAINING INTENSITY Progesterone Receptor: 2%, POSITIVE, STRONG STAINING INTENSITY Proliferation Marker Ki67: 15%    10/07/2019 Initial Diagnosis   Malignant neoplasm of upper-inner quadrant of left breast in female, estrogen receptor positive (HCC)   10/08/2019 Imaging   MRI Breast  IMPRESSION: 1. 1.5 cm biopsy-proven invasive mammary carcinoma and mammary carcinoma in situ in the posterior aspect of the upper inner quadrant of the left breast. 2. 0.8 cm biopsy-proven invasive lobular carcinoma and lobular carcinoma in situ in the upper inner quadrant of the left breast in the middle 3rd. 3. The 2 areas of malignancy in the upper inner left breast span 2.8 cm on the MR images with the clips located 3 cm apart on the post clip placement mammogram images. 4. No evidence of malignancy on the right. 5. No adenopathy.   10/2019 -  Anti-estrogen oral therapy   Anastrozole  1mg  once daily started in 10/2019. Started before her breast surgery.    11/16/2019 Genetic Testing   Negative genetic testing  on the common hereditary cancer panel.  The Common Hereditary Gene Panel offered by [facility] includes sequencing and/or deletion duplication testing of the following 48  genes: APC, ATM, AXIN2, BARD1, BMPR1A, BRCA1, BRCA2, BRIP1, CDH1, CDK4, CDKN2A (p14ARF), CDKN2A (p16INK4a), CHEK2, CTNNA1, DICER1, EPCAM (Deletion/duplication testing only), GREM1 (promoter region deletion/duplication testing only), KIT, MEN1, MLH1, MSH2, MSH3, MSH6, MUTYH, NBN, NF1, NHTL1, PALB2, PDGFRA, PMS2, POLD1, POLE, PTEN, RAD50, RAD51C, RAD51D, RNF43, SDHB, SDHC, SDHD, SMAD4, SMARCA4. STK11, TP53, TSC1, TSC2, and VHL.  The following genes were evaluated for sequence changes only: SDHA and HOXB13 c.251G>A variant only.  The report date is November 16, 2019.   11/19/2019 Surgery   LEFT BREAST LUMPECTOMY WITH RADIOACTIVE SEED AND SENTINEL LYMPH NODE BIOPSY by Dr Sierra    11/19/2019 Pathology Results   FINAL MICROSCOPIC DIAGNOSIS:   A. LYMPH NODE, LEFT AXILLARY #1, SENTINEL, EXCISION:  - One lymph node with no metastatic carcinoma (0/1).   B. LYMPH NODE, LEFT AXILLARY #2, SENTINEL, EXCISION:  - One lymph node with no metastatic carcinoma (0/1).   C. LYMPH NODE, LEFT AXILLARY #3, SENTINEL, EXCISION:  - One lymph node with no metastatic carcinoma (0/1).   D. BREAST, LEFT, LUMPECTOMY:  - Invasive and in situ lobular carcinoma, 2 tumors, 1.6 and 1.2 cm.  - Invasive lobular carcinoma 0.2 cm from the posterior and superior  margins.  - Lobular carcinoma in situ 0.1 cm from superior margin.  - Biopsy sites and biopsy clips.  - See oncology table and comment.   E. BREAST, LEFT ADDITIONAL MEDIAL INFERIOR MARGIN, EXCISION:  - Focal lobular carcinoma in situ.  - No invasive carcinoma.  - Lobular carcinoma in situ focally less than 0.1 cm from final medial  margin.    11/19/2019 Oncotype testing    Oncotype  Recurrence Score 20  Distant Recurrence risk at 9 years of 6% There is less than 1% benefit of chemotherapy.    11/19/2019 Cancer Staging   Staging form: Breast, AJCC 8th Edition - Pathologic stage from 11/19/2019: Stage IA (pT1c, pN0, cM0, G1, ER+, PR+, HER2-, Oncotype DX score: 20) -  Signed by Sierra Callander, MD on 02/11/2020 Stage prefix: Initial diagnosis Multigene prognostic tests performed: Oncotype DX Recurrence score range: Greater than or equal to 11 Histologic grading system: 3 grade system Residual tumor (R): R0 - None   01/24/2020 - 02/21/2020 Radiation Therapy   Adjuvant Radiation with Dr Sierra   05/11/2020 Survivorship   SCP delivered by Jozalynn Noyce, NP       CURRENT THERAPY: Tamoxifen   INTERVAL HISTORY Ms. Sieloff returns for follow up as scheduled. Last seen by Dr. Lanny 05/16/22. Mammogram 09/04/23.   ROS   Past Medical History:  Diagnosis Date   Breast cancer (HCC)    Chronic kidney disease    Family history of breast cancer    Family history of colon cancer    Family history of prostate cancer    History of radiation therapy 01/24/2020-02/21/2020   Left Breast; Dr. Lynwood Sierra   Hyperlipidemia    Hypertension    Osteopenia    Personal history of radiation therapy    PONV (postoperative nausea and vomiting)    Skin cancer      Past Surgical History:  Procedure Laterality Date   BREAST BIOPSY Left 09/22/2019   BREAST LUMPECTOMY Left 11/19/2019   BREAST LUMPECTOMY WITH RADIOACTIVE SEED AND SENTINEL LYMPH NODE BIOPSY Left 11/19/2019   Procedure: LEFT BREAST LUMPECTOMY WITH RADIOACTIVE SEED AND SENTINEL LYMPH NODE BIOPSY;  Surgeon: Sierra Deward MOULD, MD;  Location: [facility];  Service: General;  Laterality: Left;   EYE SURGERY     ROTATOR CUFF REPAIR Right      Outpatient Encounter Medications as of 09/22/2023  Medication Sig Note   amLODipine  (NORVASC ) 2.5 MG tablet Take 2.5 mg by mouth daily.    Bempedoic Acid (NEXLETOL) 180 MG TABS Take 180 mg by mouth daily.    Cholecalciferol (VITAMIN D3 PO) Take 25 mcg by mouth daily.    ezetimibe  (ZETIA ) 10 MG tablet Take 10 mg by mouth daily.    ibuprofen (ADVIL) 200 MG tablet Take 200 mg by mouth every 6 (six) hours as needed.    MAGNESIUM MALATE PO Take 2 capsules by mouth daily.  11/14/2020: On hold since 10/23/2020. Claudene Suzen SAILOR, RN 11/14/20 10:29 AM       OVER THE COUNTER MEDICATION Krill Oil , one gel capsule daily.    tamoxifen  (NOLVADEX ) 20 MG tablet TAKE 1 TABLET BY MOUTH DAILY.    Facility-Administered Encounter Medications as of 09/22/2023  Medication   0.9 %  sodium chloride  infusion     There were no vitals filed for this visit. There is no height or weight on file to calculate BMI.   ECOG PERFORMANCE STATUS: {CHL ONC ECOG PS:[id number]}  PHYSICAL EXAM GENERAL:alert, no distress and comfortable SKIN: no rash  EYES: sclera clear NECK: without mass LYMPH:  no palpable cervical or supraclavicular lymphadenopathy  LUNGS: clear with normal breathing effort HEART: regular rate & rhythm, no lower extremity edema ABDOMEN: abdomen soft, non-tender and normal bowel sounds NEURO: alert & oriented x 3 with fluent speech, no focal motor/sensory deficits Breast exam:  PAC without erythema    CBC    Latest Ref Rng & Units 05/16/2022    1:26 PM 11/14/2021   10:34 AM 05/14/2021    9:59 AM  CBC  WBC 4.0 - 10.5 Lara/uL 6.0  5.1  5.4   Hemoglobin 12.0 - 15.0 g/dL 86.4  85.2  85.9   Hematocrit 36.0 - 46.0 % 40.3  43.6  41.2   Platelets 150 - 400 Lara/uL 179  194  195       CMP     Latest Ref Rng & Units 05/16/2022    1:26 PM 11/14/2021   10:34 AM 05/14/2021    9:59 AM  CMP  Glucose 70 - 99 mg/dL 883  90  93   BUN 8 - 23 mg/dL 15  15  11    Creatinine 0.44 - 1.00 mg/dL 9.33  9.34  9.36   Sodium 135 - 145 mmol/L 139  138  138   Potassium 3.5 - 5.1 mmol/L 4.0  4.0  4.1   Chloride 98 - 111 mmol/L 106  105  104   CO2 22 - 32 mmol/L 26  28  29    Calcium 8.9 - 10.3 mg/dL 9.1  8.9  9.1   Total Protein 6.5 - 8.1 g/dL 6.6  7.1  6.9   Total Bilirubin 0.3 - 1.2 mg/dL 0.3  0.5  0.5   Alkaline Phos 38 - 126 U/L 50  53  65   AST 15 - 41 U/L 22  24  25    ALT 0 - 44 U/L 25  30  39       ASSESSMENT & PLAN: Sierra Lara is a 68 y.o. female with    1.   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  S/p left lumpectomy with SLNB by Dr Sierra on 11/19/19.  -Her Oncotype showed RS 20 low risk, with 6% recurrence risk of distant metastasis at 9 years of antiestrogen therapy alone.  Adjuvant chemo was not recommended.  She completed adjuvant radiation with Dr Sierra from 01/24/20 through 02/21/20.  -Given the strong ER/PR positive disease, she started on Anastrozole  neoadjuvantly in 10/2019, goal is 10 years given the lobular histology. She tolerated well for 1 year until she stopped in 10/2020 due to diffuse joint pain, leg cramps, dry eyes, low libido -changed to exemestane  11/2020, tolerated well until she developed joint pain and is currently on Tamoxifen   -Mammo 08/2023 was benign    2. Bone Health, Health maintenance -She has stopped calcium due to kidney stones.  Vitamin D  level of 20 on 08/10/2020, continue 25 mcg, with 50 mcg on Monday Wednesday Friday -DEXA 08/18/2020 shows osteopenia, lowest T score -2.0 at the right femur neck.  Overall DEXA has worsened slightly since 2019.   -We reviewed that AI's can contribute to low bone density -She began IV bisphosphonate with Zometa  every 6 months x2 years on 11/27/2020.    3. Genetic testing was negative for pathogenetic mutations    4. HTN, Weight management, HL -Per PCP -Reviewed statins can contribute to bone/joint pain and muscle cramps.  She has been off fluvastatin, reintroduced after starting exemestane , takes it QOD and tolerating well    PLAN:  No orders of the defined types were placed in this encounter.     All questions were answered. The patient knows to call the clinic with any problems, questions or concerns. No barriers to learning were detected. I spent *** counseling the patient face to face. The total time spent in the appointment  was *** and more than 50% was on counseling, review of test results, and coordination of care.   Trashawn Oquendo Lara Caedin Mogan, NP 09/21/2023 3:26 PM

## 2023-09-22 ENCOUNTER — Inpatient Hospital Stay (HOSPITAL_BASED_OUTPATIENT_CLINIC_OR_DEPARTMENT_OTHER): Admitting: Nurse Practitioner

## 2023-09-22 ENCOUNTER — Inpatient Hospital Stay: Attending: Nurse Practitioner

## 2023-09-22 ENCOUNTER — Encounter: Payer: Self-pay | Admitting: Nurse Practitioner

## 2023-09-22 VITALS — BP 120/66 | HR 69 | Temp 97.8°F | Resp 17 | Wt 160.6 lb

## 2023-09-22 DIAGNOSIS — C50212 Malignant neoplasm of upper-inner quadrant of left female breast: Secondary | ICD-10-CM

## 2023-09-22 DIAGNOSIS — Z170 Estrogen receptor positive status [ER+]: Secondary | ICD-10-CM | POA: Diagnosis not present

## 2023-09-22 DIAGNOSIS — Z803 Family history of malignant neoplasm of breast: Secondary | ICD-10-CM | POA: Diagnosis not present

## 2023-09-22 DIAGNOSIS — Z79810 Long term (current) use of selective estrogen receptor modulators (SERMs): Secondary | ICD-10-CM | POA: Diagnosis not present

## 2023-09-22 DIAGNOSIS — M8580 Other specified disorders of bone density and structure, unspecified site: Secondary | ICD-10-CM | POA: Diagnosis not present

## 2023-09-22 DIAGNOSIS — Z923 Personal history of irradiation: Secondary | ICD-10-CM | POA: Insufficient documentation

## 2023-09-22 DIAGNOSIS — Z800 Family history of malignant neoplasm of digestive organs: Secondary | ICD-10-CM | POA: Insufficient documentation

## 2023-09-22 LAB — CBC WITH DIFFERENTIAL (CANCER CENTER ONLY)
Abs Immature Granulocytes: 0 K/uL (ref 0.00–0.07)
Basophils Absolute: 0.1 K/uL (ref 0.0–0.1)
Basophils Relative: 1 %
Eosinophils Absolute: 0.1 K/uL (ref 0.0–0.5)
Eosinophils Relative: 2 %
HCT: 40.1 % (ref 36.0–46.0)
Hemoglobin: 13.5 g/dL (ref 12.0–15.0)
Immature Granulocytes: 0 %
Lymphocytes Relative: 33 %
Lymphs Abs: 1.5 K/uL (ref 0.7–4.0)
MCH: 31.1 pg (ref 26.0–34.0)
MCHC: 33.7 g/dL (ref 30.0–36.0)
MCV: 92.4 fL (ref 80.0–100.0)
Monocytes Absolute: 0.5 K/uL (ref 0.1–1.0)
Monocytes Relative: 10 %
Neutro Abs: 2.4 K/uL (ref 1.7–7.7)
Neutrophils Relative %: 54 %
Platelet Count: 189 K/uL (ref 150–400)
RBC: 4.34 MIL/uL (ref 3.87–5.11)
RDW: 12.7 % (ref 11.5–15.5)
WBC Count: 4.5 K/uL (ref 4.0–10.5)
nRBC: 0 % (ref 0.0–0.2)

## 2023-09-22 LAB — CMP (CANCER CENTER ONLY)
ALT: 34 U/L (ref 0–44)
AST: 29 U/L (ref 15–41)
Albumin: 4.1 g/dL (ref 3.5–5.0)
Alkaline Phosphatase: 45 U/L (ref 38–126)
Anion gap: 6 (ref 5–15)
BUN: 16 mg/dL (ref 8–23)
CO2: 27 mmol/L (ref 22–32)
Calcium: 9.7 mg/dL (ref 8.9–10.3)
Chloride: 106 mmol/L (ref 98–111)
Creatinine: 0.73 mg/dL (ref 0.44–1.00)
GFR, Estimated: >60 mL/min (ref >60)
Glucose, Bld: 98 mg/dL (ref 70–99)
Potassium: 4.1 mmol/L (ref 3.5–5.1)
Sodium: 139 mmol/L (ref 135–145)
Total Bilirubin: 0.3 mg/dL (ref 0.0–1.2)
Total Protein: 6.5 g/dL (ref 6.5–8.1)

## 2023-09-22 MED ORDER — TAMOXIFEN CITRATE 20 MG PO TABS: 20.0000 mg | ORAL_TABLET | Freq: Every day | ORAL | 3 refills | Status: AC

## 2023-09-24 ENCOUNTER — Ambulatory Visit (HOSPITAL_BASED_OUTPATIENT_CLINIC_OR_DEPARTMENT_OTHER): Admitting: Physical Therapy

## 2023-09-24 DIAGNOSIS — M542 Cervicalgia: Secondary | ICD-10-CM

## 2023-09-25 ENCOUNTER — Encounter (HOSPITAL_BASED_OUTPATIENT_CLINIC_OR_DEPARTMENT_OTHER): Payer: Self-pay | Admitting: Physical Therapy

## 2023-09-25 NOTE — Therapy (Signed)
 OUTPATIENT PHYSICAL THERAPY CERVICAL EVALUATION   Patient Name: Sierra Lara MRN: [id number] DOB:November 04, 1955, 68 y.o., female Today's Date: 09/25/2023  END OF SESSION:  PT End of Session - 09/25/23 0837     Visit Number 2    Number of Visits 16    Date for PT Re-Evaluation 11/13/23    PT Start Time 1645    PT Stop Time 1728    PT Time Calculation (min) 43 min    Activity Tolerance Patient tolerated treatment well    Behavior During Therapy [facility] for tasks assessed/performed           Past Medical History:  Diagnosis Date   Breast cancer (HCC)    Chronic kidney disease    Family history of breast cancer    Family history of colon cancer    Family history of prostate cancer    History of radiation therapy 01/24/2020-02/21/2020   Left Breast; Dr. Lynwood Nasuti   Hyperlipidemia    Hypertension    Osteopenia    Personal history of radiation therapy    PONV (postoperative nausea and vomiting)    Skin cancer    Past Surgical History:  Procedure Laterality Date   BREAST BIOPSY Left 09/22/2019   BREAST LUMPECTOMY Left 11/19/2019   BREAST LUMPECTOMY WITH RADIOACTIVE SEED AND SENTINEL LYMPH NODE BIOPSY Left 11/19/2019   Procedure: LEFT BREAST LUMPECTOMY WITH RADIOACTIVE SEED AND SENTINEL LYMPH NODE BIOPSY;  Surgeon: [name], MD;  Location: [facility];  Service: General;  Laterality: Left;   EYE SURGERY     ROTATOR CUFF REPAIR Right    Patient Active Problem List   Diagnosis Date Noted   Hypercholesteremia 12/29/2019   Genetic testing 11/18/2019   Family history of breast cancer    Family history of prostate cancer    Family history of colon cancer    Malignant neoplasm of upper-inner quadrant of left breast in female, estrogen receptor positive (HCC) 10/07/2019   Essential hypertension 04/28/2017   Mixed dyslipidemia 04/28/2017   Nephrolithiasis 04/28/2017   Osteopenia 04/28/2017    PCP: Lamar Simpers MD   REFERRING PROVIDER: Shelly Charity MD    REFERRING DIAG:  Diagnosis  M79.18,G89.29 (ICD-10-CM) - Chronic myofascial pain  M54.12 (ICD-10-CM) - Radiculopathy, cervical region    THERAPY DIAG:  Cervicalgia  Rationale for Evaluation and Treatment: Rehabilitation  ONSET DATE: June 11th   SUBJECTIVE:  SUBJECTIVE STATEMENT: The patient reports she feels fine until she forgets the gabapentin . Then the pain and symptoms come back.   Eval: The patient had an acute onset of right-sided cervical pain that radiated into her upper arm starting approximately 4 months ago.  She fell asleep with her head tilted to the left and woke up with cervical pain.  She trialed dry needling and physical therapy exercises with no significant benefit.  She recently began regimen of gabapentin  which has resulted in some improvement in pain.  She continues to have some radicular pain down the entire arm.  She mostly feels it with her arms hanging down when she is walking. Hand dominance: Right  PERTINENT HISTORY:  Osteopenia, breast cancer on the left  PAIN:  Are you having pain? Yes: NPRS scale: 0/10 right 1/10 over the past 5 days  Pain location: right upper trap  Pain description: aching  Aggravating factors: sleeping, and walking   Relieving factors: putting her arm over the head   PRECAUTIONS: None  RED FLAGS: None     WEIGHT BEARING RESTRICTIONS: No  FALLS:  Has patient fallen in last 6 months? No  LIVING ENVIRONMENT: Nothing   OCCUPATION:  Retired   Recreation:  Production assistant, radio   PLOF: Independent  PATIENT GOALS:  To have less pain   NEXT MD VISIT:  End of September   OBJECTIVE:  Note: Objective measures were completed at Evaluation unless otherwise noted.  DIAGNOSTIC FINDINGS:  X-ray:     PATIENT  SURVEYS:  NDI 26%  Minimum Detectable Change (90% confidence): 5 points or 10% points  COGNITION: Overall cognitive status: Within functional limits for tasks assessed  SENSATION: Pan ges down to mid upper arm   POSTURE: forward head  PALPATION:    CERVICAL ROM:   Active ROM A/PROM (deg) eval  Flexion 40   Extension 40   Right lateral flexion   Left lateral flexion   Right rotation 70 degrees   Left rotation 70 degrees   (Blank rows = not tested)  UPPER EXTREMITY ROM:  Active ROM Right eval Left eval  Shoulder flexion Full Full  Shoulder extension    Shoulder abduction    Shoulder adduction    Shoulder extension    Shoulder internal rotation Within normal limits Within normal limits  Shoulder external rotation Within normal limits Within normal limits  Elbow flexion    Elbow extension    Wrist flexion    Wrist extension    Wrist ulnar deviation    Wrist radial deviation    Wrist pronation    Wrist supination     (Blank rows = not tested)  UPPER EXTREMITY MMT:  MMT Right eval Left eval  Shoulder flexion 5 5  Shoulder extension    Shoulder abduction    Shoulder adduction    Shoulder extension    Shoulder internal rotation 5 5   Shoulder external rotation 5 5  Middle trapezius    Lower trapezius    Elbow flexion    Elbow extension    Wrist flexion    Wrist extension    Wrist ulnar deviation    Wrist radial deviation    Wrist pronation    Wrist supination    Grip strength 42 40   (Blank rows = not tested)   TREATMENT DATE:   9/11 Manual:  Trigger point release to C-3-c4 area  Trigger point release to upper traps  Use of thera-cane on trigger points  Sub-occipital release  Gentle manual traction   There-ex:  Row machine 3x12  1st set 25lbs RPE of 2       2nd/3rd set 40 lbs RPE of 5   Tricpes press down 3x12 30 lbs RPE of 2  40 lbs 2 sets RPE of 6   Bicpes curl 5 lbs x12 RPE of 5 but had pain in low trap area  Switched to 3 lbs  2x12 less pain in lower trap area.     Eval: Manual review of trigger points   There-ex:  Levator stretch for right 3x20 sec hold  Upper trap stretch 3x20 sec hold   Low row red 3x10  Shoulde extension red 3x10                                                                                                                             PATIENT EDUCATION:  Education details: HEP, symptom management, importance of posture with neck pain Person educated: Patient Education method: Explanation, Demonstration, Tactile cues, Verbal cues, and Handouts Education comprehension: verbalized understanding, returned demonstration, verbal cues required, tactile cues required, and needs further education  HOME EXERCISE PROGRAM: Access Code: [email or web address] URL: [email or web address] Date: 09/18/2023 Prepared by: Alm Don  Exercises - Gentle Levator Scapulae Stretch  - 1 x daily - 7 x weekly - 3 sets - 3 reps - 20sec  hold - Seated Upper Trapezius Stretch  - 1 x daily - 7 x weekly - 3 sets - 3 reps - 20 sec  hold - Scapular Retraction with Resistance  - 1 x daily - 7 x weekly - 3 sets - 10 reps - Shoulder extension with resistance - Neutral  - 1 x daily - 7 x weekly - 3 sets - 10 reps  ASSESSMENT:  CLINICAL IMPRESSION: The patient tolerated treatment well. She had no increase in pain with exercises except some low trap pain with bicpeps curls. We reviewed how to use her thera-cane for home on her trigger points. We will continue to progress posterior chain strengthening and manaul therapy.   Eval: Patient is a 68 year old female who presents with acute onset of cervical radiculopathy.  She had significant improvement in symptoms with gabapentin  but still has residual symptoms going to her upper arm.  She has increased pain when she walks.  Pain is relieved when she puts her arm overhead.  She has mild spasming in her upper trap and around her C4-C5 area cervical paraspinals.  Manage  reviewed base stretching and soft tissue mobilization program.  We also reviewed a posterior chain strengthening program for her to begin.  She is advised to begin below her RPE and progress per symptoms.  Benefits from skilled therapy to reduce radicular symptoms and progress her back into a full gym program. OBJECTIVE IMPAIRMENTS: decreased knowledge of condition, decreased ROM, increased muscle spasms, and pain.   ACTIVITY LIMITATIONS: carrying, standing, and locomotion level  PARTICIPATION LIMITATIONS: shopping, community activity, and walking  for exercise  PERSONAL FACTORS: 1-2 comorbidities: Osteopenia are also affecting patient's functional outcome.   REHAB POTENTIAL: Good  CLINICAL DECISION MAKING: Stable/uncomplicated  EVALUATION COMPLEXITY: Low   GOALS: Goals reviewed with patient? No  SHORT TERM GOALS: Target date: 10/16/2023    Patient will increase bilateral cervical rotation by 10 degrees Baseline:  Goal status: INITIAL  2.  Patient will report 75% reduction in radicular symptoms and right arm Baseline:  Goal status: INITIAL  3.  Patient will be independent with basic HEP Baseline:  Goal status: INITIAL  LONG TERM GOALS: Target date: 11/13/2023    Patient will ambulate for exercise without pain into her arm Baseline:  Goal status: INITIAL  2.  Patient will return to full gym program Baseline:  Goal status: INITIAL  Goal status: INITIAL   PLAN:  PT FREQUENCY: 2x/week  PT DURATION: 8 weeks  PLANNED INTERVENTIONS: [id number]-Therapeutic exercises, 97530- Therapeutic activity, [id number]- Neuromuscular re-education, 97535- Self Care, [id number]- Manual therapy, [id number]- Gait training, [id number]- Aquatic Therapy, [id number]- Electrical stimulation (unattended), 97035- Ultrasound, Patient/Family education, Stair training, Taping, Dry Needling, DME instructions, Cryotherapy, and Moist heat   PLAN FOR NEXT SESSION: Continue posterior chain strengthening .  Continue with soft  tissue mobilization if beneficial.  Consider manual traction if radicular symptoms persist.  Review current HEP.  If symptoms return consider trigger point dry needling.   Alm JINNY Don, PT 09/25/2023, 8:37 AM

## 2023-09-30 ENCOUNTER — Ambulatory Visit (HOSPITAL_BASED_OUTPATIENT_CLINIC_OR_DEPARTMENT_OTHER): Admitting: Physical Therapy

## 2023-09-30 ENCOUNTER — Encounter (HOSPITAL_BASED_OUTPATIENT_CLINIC_OR_DEPARTMENT_OTHER): Payer: Self-pay | Admitting: Physical Therapy

## 2023-09-30 DIAGNOSIS — M542 Cervicalgia: Secondary | ICD-10-CM | POA: Diagnosis not present

## 2023-09-30 NOTE — Therapy (Signed)
 OUTPATIENT PHYSICAL THERAPY CERVICAL EVALUATION   Patient Name: Sierra Lara MRN: [id number] DOB:12-08-1955, 68 y.o., female Today's Date: 09/30/2023  END OF SESSION:  PT End of Session - 09/30/23 1544     Visit Number 3    Number of Visits 16    Date for PT Re-Evaluation 11/13/23    PT Start Time 1100    PT Stop Time 1142    PT Time Calculation (min) 42 min    Activity Tolerance Patient tolerated treatment well    Behavior During Therapy WFL for tasks assessed/performed            Past Medical History:  Diagnosis Date   Breast cancer (HCC)    Chronic kidney disease    Family history of breast cancer    Family history of colon cancer    Family history of prostate cancer    History of radiation therapy 01/24/2020-02/21/2020   Left Breast; Dr. Lynwood Nasuti   Hyperlipidemia    Hypertension    Osteopenia    Personal history of radiation therapy    PONV (postoperative nausea and vomiting)    Skin cancer    Past Surgical History:  Procedure Laterality Date   BREAST BIOPSY Left 09/22/2019   BREAST LUMPECTOMY Left 11/19/2019   BREAST LUMPECTOMY WITH RADIOACTIVE SEED AND SENTINEL LYMPH NODE BIOPSY Left 11/19/2019   Procedure: LEFT BREAST LUMPECTOMY WITH RADIOACTIVE SEED AND SENTINEL LYMPH NODE BIOPSY;  Surgeon: [name], MD;  Location: [facility];  Service: General;  Laterality: Left;   EYE SURGERY     ROTATOR CUFF REPAIR Right    Patient Active Problem List   Diagnosis Date Noted   Hypercholesteremia 12/29/2019   Genetic testing 11/18/2019   Family history of breast cancer    Family history of prostate cancer    Family history of colon cancer    Malignant neoplasm of upper-inner quadrant of left breast in female, estrogen receptor positive (HCC) 10/07/2019   Essential hypertension 04/28/2017   Mixed dyslipidemia 04/28/2017   Nephrolithiasis 04/28/2017   Osteopenia 04/28/2017    PCP: Lamar Simpers MD   REFERRING PROVIDER: Shelly Charity MD    REFERRING DIAG:  Diagnosis  M79.18,G89.29 (ICD-10-CM) - Chronic myofascial pain  M54.12 (ICD-10-CM) - Radiculopathy, cervical region    THERAPY DIAG:  No diagnosis found.  Rationale for Evaluation and Treatment: Rehabilitation  ONSET DATE: June 11th   SUBJECTIVE:  SUBJECTIVE STATEMENT: The patient has not had any pain but she continues to take the medicine. She walked 7 miles the other day without pain.  Eval: The patient had an acute onset of right-sided cervical pain that radiated into her upper arm starting approximately 4 months ago.  She fell asleep with her head tilted to the left and woke up with cervical pain.  She trialed dry needling and physical therapy exercises with no significant benefit.  She recently began regimen of gabapentin  which has resulted in some improvement in pain.  She continues to have some radicular pain down the entire arm.  She mostly feels it with her arms hanging down when she is walking. Hand dominance: Right  PERTINENT HISTORY:  Osteopenia, breast cancer on the left  PAIN:  Are you having pain? Yes: NPRS scale: 0/10 right 1/10 over the past 5 days  Pain location: right upper trap  Pain description: aching  Aggravating factors: sleeping, and walking   Relieving factors: putting her arm over the head   PRECAUTIONS: None  RED FLAGS: None     WEIGHT BEARING RESTRICTIONS: No  FALLS:  Has patient fallen in last 6 months? No  LIVING ENVIRONMENT: Nothing   OCCUPATION:  Retired   Recreation:  Production assistant, radio   PLOF: Independent  PATIENT GOALS:  To have less pain   NEXT MD VISIT:  End of September   OBJECTIVE:  Note: Objective measures were completed at Evaluation unless otherwise noted.  DIAGNOSTIC FINDINGS:  X-ray:      PATIENT SURVEYS:  NDI 26%  Minimum Detectable Change (90% confidence): 5 points or 10% points  COGNITION: Overall cognitive status: Within functional limits for tasks assessed  SENSATION: Pan ges down to mid upper arm   POSTURE: forward head  PALPATION:    CERVICAL ROM:   Active ROM A/PROM (deg) eval  Flexion 40   Extension 40   Right lateral flexion   Left lateral flexion   Right rotation 70 degrees   Left rotation 70 degrees   (Blank rows = not tested)  UPPER EXTREMITY ROM:  Active ROM Right eval Left eval  Shoulder flexion Full Full  Shoulder extension    Shoulder abduction    Shoulder adduction    Shoulder extension    Shoulder internal rotation Within normal limits Within normal limits  Shoulder external rotation Within normal limits Within normal limits  Elbow flexion    Elbow extension    Wrist flexion    Wrist extension    Wrist ulnar deviation    Wrist radial deviation    Wrist pronation    Wrist supination     (Blank rows = not tested)  UPPER EXTREMITY MMT:  MMT Right eval Left eval  Shoulder flexion 5 5  Shoulder extension    Shoulder abduction    Shoulder adduction    Shoulder extension    Shoulder internal rotation 5 5   Shoulder external rotation 5 5  Middle trapezius    Lower trapezius    Elbow flexion    Elbow extension    Wrist flexion    Wrist extension    Wrist ulnar deviation    Wrist radial deviation    Wrist pronation    Wrist supination    Grip strength 42 40   (Blank rows = not tested)   TREATMENT DATE:   9/16 Manual:  Trigger point release to C-3-c4 area  Trigger point release to upper traps  Use of  thera-cane on trigger points  Sub-occipital release  Gentle manual traction   Neuro-re-ed:  Bilateral ER red 2x12  Horizontal abduction 2x12   Thera-act:  Flexion 2x10 1 lb in mirror  Scaption 2x10 1 lb     9/11 Manual:  Trigger point release to C-3-c4 area  Trigger point release to upper  traps  Use of thera-cane on trigger points  Sub-occipital release  Gentle manual traction   There-ex:  Row machine 3x12  1st set 25lbs RPE of 2       2nd/3rd set 40 lbs RPE of 5   Tricpes press down 3x12 30 lbs RPE of 2  40 lbs 2 sets RPE of 6   Bicpes curl 5 lbs x12 RPE of 5 but had pain in low trap area  Switched to 3 lbs 2x12 less pain in lower trap area.     Eval: Manual review of trigger points   There-ex:  Levator stretch for right 3x20 sec hold  Upper trap stretch 3x20 sec hold   Low row red 3x10  Shoulde extension red 3x10                                                                                                                             PATIENT EDUCATION:  Education details: HEP, symptom management, importance of posture with neck pain Person educated: Patient Education method: Explanation, Demonstration, Tactile cues, Verbal cues, and Handouts Education comprehension: verbalized understanding, returned demonstration, verbal cues required, tactile cues required, and needs further education  HOME EXERCISE PROGRAM: Access Code: [email or web address] URL: [email or web address] Date: 09/18/2023 Prepared by: Alm Don  Exercises - Gentle Levator Scapulae Stretch  - 1 x daily - 7 x weekly - 3 sets - 3 reps - 20sec  hold - Seated Upper Trapezius Stretch  - 1 x daily - 7 x weekly - 3 sets - 3 reps - 20 sec  hold - Scapular Retraction with Resistance  - 1 x daily - 7 x weekly - 3 sets - 10 reps - Shoulder extension with resistance - Neutral  - 1 x daily - 7 x weekly - 3 sets - 10 reps  ASSESSMENT:  CLINICAL IMPRESSION: Therapy continues to expand the patients HEP. We reviewed exercises she can do at home and the gym with emphasis on posture. She had no increase in pain in her neck and shoulder. We continue to work on her mid cervical trigger point as well as her upper trap.  Eval: Patient is a 68 year old female who presents with acute onset of  cervical radiculopathy.  She had significant improvement in symptoms with gabapentin  but still has residual symptoms going to her upper arm.  She has increased pain when she walks.  Pain is relieved when she puts her arm overhead.  She has mild spasming in her upper trap and around her C4-C5 area cervical paraspinals.  Manage reviewed base stretching and soft tissue mobilization program.  We also reviewed a  posterior chain strengthening program for her to begin.  She is advised to begin below her RPE and progress per symptoms.  Benefits from skilled therapy to reduce radicular symptoms and progress her back into a full gym program. OBJECTIVE IMPAIRMENTS: decreased knowledge of condition, decreased ROM, increased muscle spasms, and pain.   ACTIVITY LIMITATIONS: carrying, standing, and locomotion level  PARTICIPATION LIMITATIONS: shopping, community activity, and walking for exercise  PERSONAL FACTORS: 1-2 comorbidities: Osteopenia are also affecting patient's functional outcome.   REHAB POTENTIAL: Good  CLINICAL DECISION MAKING: Stable/uncomplicated  EVALUATION COMPLEXITY: Low   GOALS: Goals reviewed with patient? No  SHORT TERM GOALS: Target date: 10/16/2023    Patient will increase bilateral cervical rotation by 10 degrees Baseline:  Goal status: INITIAL  2.  Patient will report 75% reduction in radicular symptoms and right arm Baseline:  Goal status: INITIAL  3.  Patient will be independent with basic HEP Baseline:  Goal status: INITIAL  LONG TERM GOALS: Target date: 11/13/2023    Patient will ambulate for exercise without pain into her arm Baseline:  Goal status: INITIAL  2.  Patient will return to full gym program Baseline:  Goal status: INITIAL  Goal status: INITIAL   PLAN:  PT FREQUENCY: 2x/week  PT DURATION: 8 weeks  PLANNED INTERVENTIONS: 97110-Therapeutic exercises, 97530- Therapeutic activity, [id number]- Neuromuscular re-education, 97535- Self Care, [id number]-  Manual therapy, [id number]- Gait training, [id number]- Aquatic Therapy, [id number]- Electrical stimulation (unattended), 97035- Ultrasound, Patient/Family education, Stair training, Taping, Dry Needling, DME instructions, Cryotherapy, and Moist heat   PLAN FOR NEXT SESSION: Continue posterior chain strengthening .  Continue with soft tissue mobilization if beneficial.  Consider manual traction if radicular symptoms persist.  Review current HEP.  If symptoms return consider trigger point dry needling.   Alm JINNY Don, PT 09/30/2023, 3:47 PM

## 2023-10-01 ENCOUNTER — Encounter (HOSPITAL_BASED_OUTPATIENT_CLINIC_OR_DEPARTMENT_OTHER): Payer: Self-pay | Admitting: Physical Therapy

## 2023-10-10 ENCOUNTER — Encounter (HOSPITAL_BASED_OUTPATIENT_CLINIC_OR_DEPARTMENT_OTHER): Payer: Self-pay | Admitting: Physical Therapy

## 2023-10-10 ENCOUNTER — Ambulatory Visit (HOSPITAL_BASED_OUTPATIENT_CLINIC_OR_DEPARTMENT_OTHER): Payer: Self-pay | Admitting: Physical Therapy

## 2023-10-10 DIAGNOSIS — M542 Cervicalgia: Secondary | ICD-10-CM | POA: Diagnosis not present

## 2023-10-12 ENCOUNTER — Encounter (HOSPITAL_BASED_OUTPATIENT_CLINIC_OR_DEPARTMENT_OTHER): Payer: Self-pay | Admitting: Physical Therapy

## 2023-10-12 NOTE — Therapy (Signed)
 OUTPATIENT PHYSICAL THERAPY CERVICAL EVALUATION   Patient Name: Sierra Lara MRN: [id number] DOB:Nov 11, 1955, 68 y.o., female Today's Date: 10/12/2023  END OF SESSION:      Past Medical History:  Diagnosis Date   Breast cancer (HCC)    Chronic kidney disease    Family history of breast cancer    Family history of colon cancer    Family history of prostate cancer    History of radiation therapy 01/24/2020-02/21/2020   Left Breast; Dr. Lynwood Nasuti   Hyperlipidemia    Hypertension    Osteopenia    Personal history of radiation therapy    PONV (postoperative nausea and vomiting)    Skin cancer    Past Surgical History:  Procedure Laterality Date   BREAST BIOPSY Left 09/22/2019   BREAST LUMPECTOMY Left 11/19/2019   BREAST LUMPECTOMY WITH RADIOACTIVE SEED AND SENTINEL LYMPH NODE BIOPSY Left 11/19/2019   Procedure: LEFT BREAST LUMPECTOMY WITH RADIOACTIVE SEED AND SENTINEL LYMPH NODE BIOPSY;  Surgeon: [name], MD;  Location: [facility];  Service: General;  Laterality: Left;   EYE SURGERY     ROTATOR CUFF REPAIR Right    Patient Active Problem List   Diagnosis Date Noted   Hypercholesteremia 12/29/2019   Genetic testing 11/18/2019   Family history of breast cancer    Family history of prostate cancer    Family history of colon cancer    Malignant neoplasm of upper-inner quadrant of left breast in female, estrogen receptor positive (HCC) 10/07/2019   Essential hypertension 04/28/2017   Mixed dyslipidemia 04/28/2017   Nephrolithiasis 04/28/2017   Osteopenia 04/28/2017    PCP: Lamar Simpers MD   REFERRING PROVIDER: Shelly Charity MD   REFERRING DIAG:  Diagnosis  M79.18,G89.29 (ICD-10-CM) - Chronic myofascial pain  M54.12 (ICD-10-CM) - Radiculopathy, cervical region    THERAPY DIAG:  Cervicalgia  Rationale for Evaluation and Treatment: Rehabilitation  ONSET DATE: June 11th   SUBJECTIVE:                                                                                                                                                                                                          SUBJECTIVE STATEMENT: Patient is weaned off gabapentin  as no neck pain at this time.  She has returned to exercising and is not having any pain.  She will be beginning pelvic floor therapy soon.  Eval: The patient had an acute onset of right-sided cervical pain that radiated into her upper arm starting approximately 4 months ago.  She fell asleep with her head tilted  to the left and woke up with cervical pain.  She trialed dry needling and physical therapy exercises with no significant benefit.  She recently began regimen of gabapentin  which has resulted in some improvement in pain.  She continues to have some radicular pain down the entire arm.  She mostly feels it with her arms hanging down when she is walking. Hand dominance: Right  PERTINENT HISTORY:  Osteopenia, breast cancer on the left  PAIN:  Are you having pain? Yes: NPRS scale: 0/10 right 1/10 over the past 5 days  Pain location: right upper trap  Pain description: aching  Aggravating factors: sleeping, and walking   Relieving factors: putting her arm over the head   PRECAUTIONS: None  RED FLAGS: None     WEIGHT BEARING RESTRICTIONS: No  FALLS:  Has patient fallen in last 6 months? No  LIVING ENVIRONMENT: Nothing   OCCUPATION:  Retired   Recreation:  Production assistant, radio   PLOF: Independent  PATIENT GOALS:  To have less pain   NEXT MD VISIT:  End of September   OBJECTIVE:  Note: Objective measures were completed at Evaluation unless otherwise noted.  DIAGNOSTIC FINDINGS:  X-ray:     PATIENT SURVEYS:  NDI 26%  Minimum Detectable Change (90% confidence): 5 points or 10% points  COGNITION: Overall cognitive status: Within functional limits for tasks assessed  SENSATION: Pan ges down to mid upper arm   POSTURE: forward  head  PALPATION:    CERVICAL ROM:   Active ROM A/PROM (deg) eval  Flexion 40   Extension 40   Right lateral flexion   Left lateral flexion   Right rotation 70 degrees   Left rotation 70 degrees   (Blank rows = not tested)  UPPER EXTREMITY ROM:  Active ROM Right eval Left eval  Shoulder flexion Full Full  Shoulder extension    Shoulder abduction    Shoulder adduction    Shoulder extension    Shoulder internal rotation Within normal limits Within normal limits  Shoulder external rotation Within normal limits Within normal limits  Elbow flexion    Elbow extension    Wrist flexion    Wrist extension    Wrist ulnar deviation    Wrist radial deviation    Wrist pronation    Wrist supination     (Blank rows = not tested)  UPPER EXTREMITY MMT:  MMT Right eval Left eval  Shoulder flexion 5 5  Shoulder extension    Shoulder abduction    Shoulder adduction    Shoulder extension    Shoulder internal rotation 5 5   Shoulder external rotation 5 5  Middle trapezius    Lower trapezius    Elbow flexion    Elbow extension    Wrist flexion    Wrist extension    Wrist ulnar deviation    Wrist radial deviation    Wrist pronation    Wrist supination    Grip strength 42 40   (Blank rows = not tested)   TREATMENT DATE:   9/26 Manual:  Trigger point release to C-3-c4 area  Trigger point release to upper traps  Use of thera-cane on trigger points  Sub-occipital release  Gentle manual traction   There ex: UBE 2 minutes for 2 minutes back Reviewed HEP and how to use at home Reviewed difference between stretches and soft tissue mobilization exercises  Thera-act:  Flexion 2x10 1 lb in mirror  Scaption 2x10 1 lb  9/16 Manual:  Trigger point release to C-3-c4 area  Trigger point release to upper traps  Use of thera-cane on trigger points  Sub-occipital release  Gentle manual traction   Neuro-re-ed:  Bilateral ER red 2x12  Horizontal abduction 2x12    Thera-act:  Flexion 2x10 1 lb in mirror  Scaption 2x10 1 lb   Neuromuscular reeducation: Row machine 3x12  30 pounds RPE 4 Tricpes press down 3x12 40 pounds RPE of 4  Cable shoulder extension 10 pounds RPE of four 3 x 12 9/11 Manual:  Trigger point release to C-3-c4 area  Trigger point release to upper traps  Use of thera-cane on trigger points  Sub-occipital release  Gentle manual traction   There-ex:  Row machine 3x12  1st set 25lbs RPE of 2       2nd/3rd set 40 lbs RPE of 5   Tricpes press down 3x12 30 lbs RPE of 2  40 lbs 2 sets RPE of 6   Bicpes curl 5 lbs x12 RPE of 5 but had pain in low trap area  Switched to 3 lbs 2x12 less pain in lower trap area.     Eval: Manual review of trigger points   There-ex:  Levator stretch for right 3x20 sec hold  Upper trap stretch 3x20 sec hold   Low row red 3x10  Shoulde extension red 3x10                                                                                                                             PATIENT EDUCATION:  Education details: HEP, symptom management, importance of posture with neck pain Person educated: Patient Education method: Explanation, Demonstration, Tactile cues, Verbal cues, and Handouts Education comprehension: verbalized understanding, returned demonstration, verbal cues required, tactile cues required, and needs further education  HOME EXERCISE PROGRAM: Access Code: [email or web address] URL: [email or web address] Date: 09/18/2023 Prepared by: Alm Don  Exercises - Gentle Levator Scapulae Stretch  - 1 x daily - 7 x weekly - 3 sets - 3 reps - 20sec  hold - Seated Upper Trapezius Stretch  - 1 x daily - 7 x weekly - 3 sets - 3 reps - 20 sec  hold - Scapular Retraction with Resistance  - 1 x daily - 7 x weekly - 3 sets - 10 reps - Shoulder extension with resistance - Neutral  - 1 x daily - 7 x weekly - 3 sets - 10 reps  ASSESSMENT:  CLINICAL IMPRESSION: The patient has  progressed well.  She is no longer having pain.  She continues to have mild tension.  We reviewed her use to work on for self soft tissue mobilization.  She has a full exercise program.  She is advised to continue with current exercise program.  She is advised if she has any kind of reactivation of her symptoms and to schedule another physical therapy visit.  Otherwise we will discharge to HEP.  Eval: Patient is a 68 year old female  who presents with acute onset of cervical radiculopathy.  She had significant improvement in symptoms with gabapentin  but still has residual symptoms going to her upper arm.  She has increased pain when she walks.  Pain is relieved when she puts her arm overhead.  She has mild spasming in her upper trap and around her C4-C5 area cervical paraspinals.  Manage reviewed base stretching and soft tissue mobilization program.  We also reviewed a posterior chain strengthening program for her to begin.  She is advised to begin below her RPE and progress per symptoms.  Benefits from skilled therapy to reduce radicular symptoms and progress her back into a full gym program. OBJECTIVE IMPAIRMENTS: decreased knowledge of condition, decreased ROM, increased muscle spasms, and pain.   ACTIVITY LIMITATIONS: carrying, standing, and locomotion level  PARTICIPATION LIMITATIONS: shopping, community activity, and walking for exercise  PERSONAL FACTORS: 1-2 comorbidities: Osteopenia are also affecting patient's functional outcome.   REHAB POTENTIAL: Good  CLINICAL DECISION MAKING: Stable/uncomplicated  EVALUATION COMPLEXITY: Low   GOALS: Goals reviewed with patient? No  SHORT TERM GOALS: Target date: 10/16/2023    Patient will increase bilateral cervical rotation by 10 degrees Baseline:  Goal status: INITIAL  2.  Patient will report 75% reduction in radicular symptoms and right arm Baseline:  Goal status: INITIAL  3.  Patient will be independent with basic HEP Baseline:   Goal status: INITIAL  LONG TERM GOALS: Target date: 11/13/2023    Patient will ambulate for exercise without pain into her arm Baseline:  Goal status: INITIAL  2.  Patient will return to full gym program Baseline:  Goal status: INITIAL  Goal status: INITIAL   PLAN:  PT FREQUENCY: 2x/week  PT DURATION: 8 weeks  PLANNED INTERVENTIONS: 97110-Therapeutic exercises, 97530- Therapeutic activity, [id number]- Neuromuscular re-education, 97535- Self Care, [id number]- Manual therapy, [id number]- Gait training, [id number]- Aquatic Therapy, 97014- Electrical stimulation (unattended), 97035- Ultrasound, Patient/Family education, Stair training, Taping, Dry Needling, DME instructions, Cryotherapy, and Moist heat   PLAN FOR NEXT SESSION: Continue posterior chain strengthening .  Continue with soft tissue mobilization if beneficial.  Consider manual traction if radicular symptoms persist.  Review current HEP.  If symptoms return consider trigger point dry needling.   Alm JINNY Don, PT 10/12/2023, 8:20 PM

## 2023-10-13 ENCOUNTER — Encounter (HOSPITAL_BASED_OUTPATIENT_CLINIC_OR_DEPARTMENT_OTHER): Admitting: Physical Therapy

## 2023-10-14 ENCOUNTER — Ambulatory Visit (HOSPITAL_BASED_OUTPATIENT_CLINIC_OR_DEPARTMENT_OTHER): Admitting: Physical Therapy

## 2024-09-21 ENCOUNTER — Other Ambulatory Visit

## 2024-09-21 ENCOUNTER — Ambulatory Visit: Admitting: Nurse Practitioner

## 2024-09-27 ENCOUNTER — Other Ambulatory Visit

## 2024-09-27 ENCOUNTER — Ambulatory Visit: Admitting: Nurse Practitioner
